# Patient Record
Sex: Female | Born: 1980 | Race: White | Hispanic: No | Marital: Married | Smoking: Current every day smoker
Health system: Southern US, Community
[De-identification: ages and names within clinical notes are randomized; demographics above are authoritative.]

## PROBLEM LIST (undated history)

## (undated) DIAGNOSIS — F17200 Nicotine dependence, unspecified, uncomplicated: Secondary | ICD-10-CM

## (undated) DIAGNOSIS — S37009A Unspecified injury of unspecified kidney, initial encounter: Secondary | ICD-10-CM

## (undated) DIAGNOSIS — A4902 Methicillin resistant Staphylococcus aureus infection, unspecified site: Secondary | ICD-10-CM

## (undated) HISTORY — DX: Nicotine dependence, unspecified, uncomplicated: F17.200

---

## 2005-09-01 HISTORY — PX: KIDNEY SURGERY: SHX687

## 2007-04-26 ENCOUNTER — Ambulatory Visit (HOSPITAL_COMMUNITY): Admission: RE | Admit: 2007-04-26 | Discharge: 2007-04-26 | Payer: Self-pay | Admitting: Emergency Medicine

## 2007-04-26 ENCOUNTER — Emergency Department (HOSPITAL_COMMUNITY): Admission: EM | Admit: 2007-04-26 | Discharge: 2007-04-26 | Payer: Self-pay | Admitting: Family Medicine

## 2007-04-29 ENCOUNTER — Ambulatory Visit: Payer: Self-pay | Admitting: *Deleted

## 2007-05-05 ENCOUNTER — Ambulatory Visit (HOSPITAL_COMMUNITY): Admission: RE | Admit: 2007-05-05 | Discharge: 2007-05-05 | Payer: Self-pay | Admitting: *Deleted

## 2007-05-05 ENCOUNTER — Ambulatory Visit: Payer: Self-pay | Admitting: *Deleted

## 2007-05-20 ENCOUNTER — Ambulatory Visit: Payer: Self-pay | Admitting: *Deleted

## 2009-02-06 ENCOUNTER — Emergency Department (HOSPITAL_COMMUNITY): Admission: EM | Admit: 2009-02-06 | Discharge: 2009-02-06 | Payer: Self-pay | Admitting: Family Medicine

## 2009-02-08 ENCOUNTER — Emergency Department (HOSPITAL_COMMUNITY): Admission: EM | Admit: 2009-02-08 | Discharge: 2009-02-08 | Payer: Self-pay | Admitting: Family Medicine

## 2009-02-10 ENCOUNTER — Emergency Department (HOSPITAL_COMMUNITY): Admission: EM | Admit: 2009-02-10 | Discharge: 2009-02-10 | Payer: Self-pay | Admitting: Family Medicine

## 2010-12-09 LAB — CULTURE, ROUTINE-ABSCESS

## 2011-01-14 NOTE — Op Note (Signed)
NAMEGIAMARIE, Karina [name]                ACCOUNT NO.:  [id number]   MEDICAL RECORD NO.:  [id number]          PATIENT TYPE:  AMB   LOCATION:  SDS                          FACILITY:  [facility]   PHYSICIAN:  Balinda Quails, M.D.    DATE OF BIRTH:  [birth year]   DATE OF PROCEDURE:  05/05/2007  DATE OF DISCHARGE:                               OPERATIVE REPORT   SURGEON:  Denman George, MD.   DIAGNOSIS:  Right renal infarction.   PROCEDURES:  1. Abdominal aortogram.  2. Selective right renal arteriogram.   ACCESS:  Right common femoral artery 5-French sheath.   CONTRAST:  Visipaque 80 mL.   COMPLICATIONS:  None apparent.   CLINICAL NOTE:  Karina [name] is a 30 year old female with  neurofibromatosis who presented to the emergency department last week  with right flank pain.  Workup for this revealed a right renal  infarction.  She is brought to the cath lab at this time for abdominal  aortogram and selective renal arteriogram.   DESCRIPTION OF PROCEDURE:  The patient brought to the cath lab in stable  condition.  Placed in supine position.  Skin and subcutaneous tissue of  the right groin instilled with 1% Xylocaine.  A needle introduced in the  right common femoral artery.  A 0.035 Wholey guidewire advanced through  the needle into the mid abdominal aorta.  A 5-French sheath advanced  over the guidewire.  Dilator removed, sheath flushed with heparin saline  solution.   Pigtail catheter advanced over the guidewire to the mid abdominal aorta.  Standard pigtail catheter advanced over the guidewire.  Standard AP mid  abdominal aortogram obtained.  Unfortunately, the patient had the  hiccups.  This obscured the aortogram.  However, it was clear that the  patient had single right renal artery.  There were two left renal  arteries.  An accessory pole left inferior renal artery was present.  There was brisk flow noted through the superior mesenteric and celiac  axis.  The juxtarenal and  infrarenal aorta appeared normal.  The common  iliac arteries bilaterally appeared normal.   Guidewire exchange then made for a short right coronary catheter.  This  was engaged into the right renal artery.  A selective right renal  arteriography obtained with hand injection of contrast.  Initial image  revealed the catheter to be in the right adrenal artery, catheter  withdrawn and placed in the right main renal artery.  Right renal  arteriography obtained with RAO projection.  This revealed the right  main renal artery to be widely patent.  There was a large superior pole  branch arising early which appeared normal.  The main right renal artery  appeared normal into the kidney.  The area of infarction of the inferior  pole was obvious.   There was no evidence of renal artery dissection or renal artery  aneurysm.   This completed the arteriogram procedure.  The guidewire reinserted,  catheter removed.  Right femoral sheath removed.   No apparent complications.   FINAL IMPRESSION:  1. Normal abdominal aortogram.  2. Normal  right renal arteriogram revealing right inferior pole renal      infarction.   RECOMMENDATION:  The patient will be discharged today.  Follow-up in the  office with further workup.      Balinda Quails, M.D.  Electronically Signed     PGH/MEDQ  D:  05/05/2007  T:  05/05/2007  Job:  [id number]

## 2011-01-14 NOTE — Consult Note (Signed)
NEW PATIENT CONSULTATION   [name], [name]  DOB:  November 29, 1980                                       04/29/2007  NUUVO#:[id number]   REASON FOR CONSULTATION:  Right renal infarction.   HISTORY OF PRESENT ILLNESS:  Karina [name] is a 30 year old female with  known neurofibromatosis who presented to the emergency department of  [facility] on April 26, 2007 with a history of approximately 2  days of right sided abdominal and flank pain. This was sudden in onset.  Felt to be severe in nature. Scout has a known history of  neurofibromatosis. Family history includes neurofibromatosis in her  mother, her maternal grandmother and her maternal great grandfather.   DIAGNOSTIC STUDIES:  In the emergency department, she underwent CT scan,  which verified a right renal infarction. No other abnormalities were  noted.   PAST MEDICAL HISTORY:  Neurofibromatosis.   PAST SURGICAL HISTORY:  None.   SOCIAL HISTORY:  The patient is single. She works as a Child psychotherapist at [employer].  She does have a steady boyfriend. She is sexually active. She has never  been pregnant. She smokes about 1/2 pack of cigarettes daily, consumes a  little alcohol.   REVIEW OF SYSTEMS:  With this pain, she has had a significant loss in  appetite. She otherwise, denies any symptoms of shortness of breath or  chest pain. No visual disturbance. Regular bowel habits. Regular  menstrual cycle.   FAMILY HISTORY:  Neurofibromatosis in documented in her mother, maternal  grandmother and her great grandfather.   MEDICATIONS:  Oxycodone 5 to 10 mg q.4 hours p.r.n.   ALLERGIES:  NO KNOWN DRUG ALLERGIES.   PHYSICAL EXAMINATION:  GENERAL:  A 30 year old female who appears her  stated age. She does have a few pigmented spots. She also has some  neuromas on the skin.  VITAL SIGNS:  Blood pressure 129/76, pulse 99 per minute.  NECK:  Supple. No thyromegaly or adenopathy.  CARDIOVASCULAR:  Normal heart sounds  without murmurs. No carotid bruits.  CHEST:  Equal entry bilaterally. No rales or rhonchi.  ABDOMEN:  Soft. Right mid abdominal and flank tenderness. No guarding or  rebound. Bowel sounds are normal. No abdominal bruits. 2+ femoral pulses  bilaterally.  EXTREMITIES:  Intact dorsalis pedis and posterior tibial pulses  bilaterally. No femoral bruits.   IMPRESSION:  1. Right renal infarction.  2. Neurofibromatosis.   RECOMMENDATIONS:  1. Genetic counseling. The patient referred to Dr. Charise Killian.  2. 2-D echocardiogram.  3. Renal arteriogram.   Balinda Quails, M.D.  Electronically Signed   PGH/MEDQ  D:  04/29/2007  T:  05/01/2007  Job:  [id number]   cc:   Link Snuffer, M.D.

## 2011-01-14 NOTE — Assessment & Plan Note (Signed)
OFFICE VISIT   LEONTINE, RADMAN A  DOB:  06-Feb-1981                                       05/20/2007  EAVWU#:[id number]   Patient returns today to review final results of her workup after  suffering a right inferior pole renal infarct.   She underwent a right renal arteriogram on 05/05/07.  This was  unremarkable.  There was evidence of right inferior pole renal  infarction on arteriography.   A 2D echo has been performed to rule out embolic source.  This was  unremarkable.   I have reviewed these results with the patient and her mother today.  Her blood pressure is 113/66, pulse 94, and respirations are 18.   Patient also has had significant improvement in her right flank pain.  Not taking anymore narcotics at this time and has begun to return to  work.   I discharged from further schedule to follow up at this time.  Return  p.r.n.   Balinda Quails, M.D.  Electronically Signed   PGH/MEDQ  D:  05/20/2007  T:  05/21/2007  Job:  [id number]

## 2011-01-14 NOTE — Procedures (Signed)
RENAL ARTERY DUPLEX EVALUATION   INDICATION:  Right lower quadrant pain radiating to the right side of  the back.   HISTORY:  Diabetes:  No  Cardiac:  No  Hypertension:  No  Smoking:  Yes.   RENAL ARTERY DUPLEX FINDINGS:  Aorta-Proximal:  119 cm/s  Aorta-Mid:  110 cm/s  Aorta-Distal:  100 cm/s  Celiac Artery Origin:  211cm/s  SMA Origin:  74 cm/s                                    RIGHT               LEFT  Renal Artery Origin:             74 cm/s             70 cm/s  Renal Artery Proximal:           80 cm/s             79 cm/s  Renal Artery Mid:                114 cm/s            78 cm/s  Renal Artery Distal:             108 cm/s            71 cm/s  Hilar Acceleration Time (AT):    m/s2                m/s2  Renal-Aortic Ratio (RAR):        0.95                0.66  Kidney Size:                     11.15 cm            12.08 cm  End Diastolic Ratio (EDR):  Resistive Index (RI):            0.52                0.70   IMPRESSION:  1. No evidence of renal artery stenosis noted bilaterally.  2. Normal resistive indices noted bilaterally.  3. Bilateral kidneys are within normal limits.   ___________________________________________  P. Liliane Bade, M.D.   MG/MEDQ  D:  04/29/2007  T:  04/30/2007  Job:  [id number]

## 2011-06-13 LAB — HEPATIC FUNCTION PANEL
ALT: 20
AST: 18
Albumin: 3.7
Alkaline Phosphatase: 57
Indirect Bilirubin: 0.6
Total Protein: 7.2

## 2011-06-13 LAB — CBC
HCT: 38.3
HCT: 36.8
Hemoglobin: 13.1
MCHC: 34.2
MCHC: 35
MCV: 88.8
MCV: 88.8
Platelets: 380
RBC: 4.15
RDW: 12.1
WBC: 12 — ABNORMAL HIGH

## 2011-06-13 LAB — COMPREHENSIVE METABOLIC PANEL
Albumin: 3.3 — ABNORMAL LOW
BUN: 14
Creatinine, Ser: 0.56
Glucose, Bld: 111 — ABNORMAL HIGH
Total Protein: 7.3

## 2011-06-13 LAB — URINALYSIS, ROUTINE W REFLEX MICROSCOPIC
Bilirubin Urine: NEGATIVE
Glucose, UA: NEGATIVE
Ketones, ur: 40 — AB
Nitrite: POSITIVE — AB
Specific Gravity, Urine: 1.025
pH: 6

## 2011-06-13 LAB — DIFFERENTIAL
Basophils Relative: 0
Eosinophils Absolute: 0
Eosinophils Relative: 0
Lymphs Abs: 1.3
Monocytes Absolute: 0.7
Monocytes Relative: 6
Neutrophils Relative %: 83 — ABNORMAL HIGH

## 2011-06-13 LAB — PROTIME-INR
INR: 1.1
Prothrombin Time: 14.2

## 2011-06-13 LAB — I-STAT 8, (EC8 V) (CONVERTED LAB)
BUN: 18
Bicarbonate: 22.2
Glucose, Bld: 102 — ABNORMAL HIGH
Hemoglobin: 13.6
TCO2: 23
pCO2, Ven: 32.7 — ABNORMAL LOW
pH, Ven: 7.439 — ABNORMAL HIGH

## 2011-06-13 LAB — POCT URINALYSIS DIP (DEVICE)
Bilirubin Urine: NEGATIVE
Ketones, ur: NEGATIVE
Nitrite: POSITIVE — AB
Protein, ur: NEGATIVE
pH: 5.5

## 2011-06-13 LAB — POCT PREGNANCY, URINE
Operator id: 235561
Preg Test, Ur: NEGATIVE

## 2011-06-13 LAB — URINE MICROSCOPIC-ADD ON

## 2011-06-13 LAB — APTT: aPTT: 29

## 2011-06-13 LAB — GC/CHLAMYDIA PROBE AMP, GENITAL: Chlamydia, DNA Probe: NEGATIVE

## 2012-05-07 ENCOUNTER — Emergency Department (INDEPENDENT_AMBULATORY_CARE_PROVIDER_SITE_OTHER)
Admission: EM | Admit: 2012-05-07 | Discharge: 2012-05-07 | Disposition: A | Discharge status: Home / Self Care | Payer: Self-pay | Source: Home / Self Care | Attending: Emergency Medicine | Admitting: Emergency Medicine

## 2012-05-07 ENCOUNTER — Encounter (HOSPITAL_COMMUNITY): Payer: Self-pay | Admitting: Emergency Medicine

## 2012-05-07 DIAGNOSIS — N764 Abscess of vulva: Secondary | ICD-10-CM

## 2012-05-07 HISTORY — DX: Unspecified injury of unspecified kidney, initial encounter: S37.009A

## 2012-05-07 HISTORY — DX: Methicillin resistant Staphylococcus aureus infection, unspecified site: A49.02

## 2012-05-07 MED ORDER — SULFAMETHOXAZOLE-TRIMETHOPRIM 800-160 MG PO TABS: 1.0000 | ORAL_TABLET | Freq: Two times a day (BID) | ORAL | Status: AC

## 2012-05-07 MED ORDER — IBUPROFEN 600 MG PO TABS: 600.0000 mg | ORAL_TABLET | Freq: Four times a day (QID) | ORAL | Status: AC | PRN

## 2012-05-07 MED ORDER — LIDOCAINE-EPINEPHRINE 2 %-1:100000 IJ SOLN
5.0000 mL | Freq: Once | INTRAMUSCULAR | Status: AC
  Administered 2012-05-07: 5 mL

## 2012-05-07 MED ORDER — CHLORHEXIDINE GLUCONATE 4 % EX LIQD: 60.0000 mL | Freq: Every day | CUTANEOUS | Status: AC | PRN

## 2012-05-07 MED ORDER — BACITRACIN 500 UNIT/GM EX OINT
1.0000 "application " | TOPICAL_OINTMENT | Freq: Once | CUTANEOUS | Status: AC
  Administered 2012-05-07: 1 via TOPICAL

## 2012-05-07 NOTE — ED Notes (Signed)
Pt reports "boil" on left labia. Onset 2-3 days ago. Hx of same one year ago.

## 2012-05-07 NOTE — ED Notes (Signed)
Pt instructed to do Sitz baths 3-4 x day, apply bacitracin and DSD after each Sitz bath.

## 2012-05-07 NOTE — ED Provider Notes (Signed)
History     CSN: [id number]  Arrival date & time 05/07/12  0801   First MD Initiated Contact with Patient 05/07/12 [phone]      Chief Complaint  Patient presents with  . Recurrent Skin Infections    (Consider location/radiation/quality/duration/timing/severity/associated sxs/prior treatment) HPI Comments: Pt with painful erythematous mass of gradually increasing size in her left outer labia.  Has been doing warm sitz baths and warm compresses, and has able to express some purulent drainage from it. Took ibuprofen 600 mg this morning No N/V, fevers, drainage. H/o of same symptoms in this area in 2005, or heart drainage. h/o of MRSA abscess on posterior right thigh in 2010. She was unable tolerate Bactrim, caused stomach upset-but was able to take doxycycline without problems.  ROS as noted in HPI. All other ROS negative.   Patient is a 31 y.o. female presenting with abscess. The history is provided by the patient. No language interpreter was used.  Abscess  The current episode started less than one week ago. The onset was gradual. The problem has been unchanged. The abscess is present on the genitalia. The abscess is characterized by painfulness, draining and swelling. It is unknown what she was exposed to. The abscess first occurred at home. Pertinent negatives include no fever. There were no sick contacts.    Past Medical History  Diagnosis Date  . 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 MRSA (methicillin resistant Staphylococcus aureus)     Past Surgical History  Procedure Date  . Kidney surgery     TO REMOVE BLOD CLOT    History reviewed. No pertinent family history.  History  Substance Use Topics  . Smoking status: Current Everyday Smoker -- 1.0 packs/day  . Smokeless tobacco: Not on file  . Alcohol Use: No    OB History    Grav Para Term Preterm Abortions TAB SAB Ect Mult Living                  Review of Systems  Constitutional: Negative  for fever.    Allergies  Review of patient's allergies indicates no known allergies.  Home Medications   Current Outpatient Rx  Name Route Sig Dispense Refill  . IBUPROFEN 600 MG PO TABS Oral Take 600 mg by mouth every 6 (six) hours as needed.    . CHLORHEXIDINE GLUCONATE 4 % EX LIQD Topical Apply 60 mLs (4 application total) topically daily as needed. Use daily when bathing for 1-2 weeks 120 mL 0  . IBUPROFEN 600 MG PO TABS Oral Take 1 tablet (600 mg total) by mouth every 6 (six) hours as needed for pain. 30 tablet 0  . SULFAMETHOXAZOLE-TRIMETHOPRIM 800-160 MG PO TABS Oral Take 1 tablet by mouth 2 (two) times daily. X 10 days 20 tablet 0    BP 103/76  Pulse 93  Temp 97.6 F (36.4 C) (Oral)  Resp 18  SpO2 99%  LMP 05/03/2012  Physical Exam  Nursing note and vitals reviewed. Constitutional: She is oriented to person, place, and time. She appears well-developed and well-nourished. No distress.  HENT:  Head: Normocephalic and atraumatic.  Eyes: Conjunctivae and EOM are normal.  Neck: Normal range of motion.  Cardiovascular: Normal rate.   Pulmonary/Chest: Effort normal.  Abdominal: She exhibits no distension.  Genitourinary: Vagina normal.    There is rash, tenderness and lesion on the left labia.       Tender, erythematous mass with central pustule approximately 1  x 2 cm left lower labia, see drawing. No evidence of Bartholin's cyst.  Musculoskeletal: Normal range of motion.  Neurological: She is alert and oriented to person, place, and time. Coordination normal.  Skin: Skin is warm and dry.  Psychiatric: She has a normal mood and affect. Her behavior is normal. Judgment and thought content normal.    ED Course  INCISION AND DRAINAGE Date/Time: 05/07/2012 9:08 AM Performed by: Luiz Blare Authorized by: Luiz Blare Consent: Verbal consent obtained. Risks and benefits: risks, benefits and alternatives were discussed Consent given by: patient Patient  understanding: patient states understanding of the procedure being performed Patient consent: the patient's understanding of the procedure matches consent given Procedure consent: procedure consent matches procedure scheduled Required items: required blood products, implants, devices, and special equipment available Patient identity confirmed: verbally with patient Time out: Immediately prior to procedure a "time out" was called to verify the correct patient, procedure, equipment, support staff and site/side marked as required. Type: abscess Body area: anogenital Location details: vulva Anesthesia: local infiltration Local anesthetic: lidocaine 2% with epinephrine Anesthetic total: 1 ml Patient sedated: no Scalpel size: 11 Incision type: cruciate. Complexity: simple Drainage: purulent Drainage amount: moderate Wound treatment: wound left open Packing material: none Patient tolerance: Patient tolerated the procedure well with no immediate complications. Comments: Sent drainage off for cx   (including critical care time)   Labs Reviewed  CULTURE, ROUTINE-ABSCESS   No results found.   1. Abscess of left genital labia     MDM  Previous chart, labs reviewed as noted in history of present illness  Prev abscess grew out mrsa but this was on back of leg. Today appears to be folliculitis/small abscess external L  Will send home with doxy given h/o mrsa.  RTC if no significant improvement in 2 days sooner if she gets worse. Referring to primary care resources for routine medical care pt agrees with plan   Luiz Blare, MD 05/07/12 [phone]

## 2012-05-10 ENCOUNTER — Telehealth (HOSPITAL_COMMUNITY): Payer: Self-pay | Admitting: *Deleted

## 2012-05-10 LAB — CULTURE, ROUTINE-ABSCESS: Gram Stain: NONE SEEN

## 2012-05-10 NOTE — ED Notes (Signed)
Abscess culture L outer labia:  Mod. MRSA. Pt. adequately treated with Septra DS. I called and left a message to call on home and cell numbers.

## 2012-05-11 ENCOUNTER — Telehealth (HOSPITAL_COMMUNITY): Payer: Self-pay | Admitting: *Deleted

## 2012-05-11 NOTE — ED Notes (Signed)
Pt. called back.  Pt. verified x 2 and given results.  Pt. Told she was adequately treated with Septra DS.  Pt. said she had MRSA 2 yrs ago.  Reviewed MRSA instructions. Pt. c/o nausea while taking antibiotics.  Instructed take antibiotics with food.  Pt. voiced understanding. Vassie Moselle 05/11/2012

## 2012-12-20 ENCOUNTER — Encounter: Payer: Self-pay | Admitting: Family Medicine

## 2012-12-20 ENCOUNTER — Ambulatory Visit (INDEPENDENT_AMBULATORY_CARE_PROVIDER_SITE_OTHER): Payer: No Typology Code available for payment source | Admitting: Family Medicine

## 2012-12-20 VITALS — BP 92/60 | HR 84 | Ht 60.0 in | Wt 190.0 lb

## 2012-12-20 DIAGNOSIS — K219 Gastro-esophageal reflux disease without esophagitis: Secondary | ICD-10-CM | POA: Insufficient documentation

## 2012-12-20 DIAGNOSIS — E669 Obesity, unspecified: Secondary | ICD-10-CM | POA: Insufficient documentation

## 2012-12-20 DIAGNOSIS — F17200 Nicotine dependence, unspecified, uncomplicated: Secondary | ICD-10-CM

## 2012-12-20 NOTE — Patient Instructions (Addendum)
Diet for Gastroesophageal Reflux Disease, Adult Reflux (acid reflux) is when acid from your stomach flows up into the esophagus. When acid comes in contact with the esophagus, the acid causes irritation and soreness (inflammation) in the esophagus. When reflux happens often or so severely that it causes damage to the esophagus, it is called gastroesophageal reflux disease (GERD). Nutrition therapy can help ease the discomfort of GERD. FOODS OR DRINKS TO AVOID OR LIMIT  Smoking or chewing tobacco. Nicotine is one of the most potent stimulants to acid production in the gastrointestinal tract.  Caffeinated and decaffeinated coffee and black tea.  Regular or low-calorie carbonated beverages or energy drinks (caffeine-free carbonated beverages are allowed).   Strong spices, such as black pepper, white pepper, red pepper, cayenne, curry powder, and chili powder.  Peppermint or spearmint.  Chocolate.  High-fat foods, including meats and fried foods. Extra added fats including oils, butter, salad dressings, and nuts. Limit these to less than 8 tsp per day.  Fruits and vegetables if they are not tolerated, such as citrus fruits or tomatoes.  Alcohol.  Any food that seems to aggravate your condition. If you have questions regarding your diet, call your caregiver or a registered dietitian. OTHER THINGS THAT MAY HELP GERD INCLUDE:   Eating your meals slowly, in a relaxed setting.  Eating 5 to 6 small meals per day instead of 3 large meals.  Eliminating food for a period of time if it causes distress.  Not lying down until 3 hours after eating a meal.  Keeping the head of your bed raised 6 to 9 inches (15 to 23 cm) by using a foam wedge or blocks under the legs of the bed. Lying flat may make symptoms worse.  Being physically active. Weight loss may be helpful in reducing reflux in overweight or obese adults.  Wear loose fitting clothing EXAMPLE MEAL PLAN This meal plan is approximately  2,000 calories based on https://www.bernard.org/ meal planning guidelines. Breakfast   cup cooked oatmeal.  1 cup strawberries.  1 cup low-fat milk.  1 oz almonds. Snack  1 cup cucumber slices.  6 oz yogurt (made from low-fat or fat-free milk). Lunch  2 slice whole-wheat bread.  2 oz sliced [place].  2 tsp mayonnaise.  1 cup blueberries.  1 cup snap peas. Snack  6 whole-wheat crackers.  1 oz string cheese. Dinner   cup brown rice.  1 cup mixed veggies.  1 tsp olive oil.  3 oz grilled fish. Document Released: 08/18/2005 Document Revised: 11/10/2011 Document Reviewed: 07/04/2011 [facility] Patient Information 2013 [place], [place].  Start taking Prilosec OTC once daily--okay to take prior to dinner (30 min).  If your symptoms are worse during the day, rather than at night, you can switch to morning dosing (before breakfast).  If needed, you can take it twice daily, short-term (for 2-4 weeks).  Okay to use prilosec longer than the 2 weeks that the box says, but try to make changes in the diet/behavior so that you can cut back to taking it just as needed, rather than every day, if you are able to.  Please try and quit smoking--start thinking about why/when you smoke (habit, boredom, stress) in order to come up with effective strategies to cut back or quit. Available resources to help you quit include free counseling through [facility] ([facility]Quitline.com or 1-800-QUITNOW), smoking cessation classes through [facility] (call to find out schedule), over-the-counter nicotine replacements, and e-cigarettes (although this may not help break the  hand-mouth habit).  Many insurance companies also have smoking cessation programs (which may decrease the cost of patches, meds if enrolled).  If these methods are not effective for you, and you are motivated to quit, return to discuss the possibility of prescription medications.

## 2012-12-20 NOTE — Progress Notes (Signed)
Chief Complaint  Patient presents with  . Heartburn    new patient that complains of heartburn every day x 1 year, at least.    She has had daily heartburn for about a year.  Any food/drink seems to cause heartburn.  She has been taking Tums three times daily, and sometimes helps.  She has tried Zantac, without much effect.  Prilosec was helpful when tried in the past, but she would forget to take it.  Denies any dysphagia.  She has nocturnal symptoms and has had to get up to vomit.  Denies abdominal pain.  Denies bloody or black BM.  Denies significant changes in diet.  She is a smoker.  She denies pregnancy--has had unprotected intercourse, but reports she has done this for a while, doesn't think she can get pregnant.  Denies current pregnancy--LMP was 4/1.    Past Medical History  Diagnosis Date  . 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 MRSA (methicillin resistant Staphylococcus aureus)     (labial, and posterior thigh)  . Smoker     Past Surgical History  Procedure Laterality Date  . Kidney surgery  2007    TO REMOVE BLO)D CLOT    History   Social History  . Marital Status: Single    Spouse Name: N/A    Number of Children: N/A  . Years of Education: N/A   Occupational History  . Environmental health practitioner at [employer]    Social History Main Topics  . Smoking status: Current Every Day Smoker -- 1.00 packs/day    Types: Cigarettes  . Smokeless tobacco: Never Used  . Alcohol Use: Yes     Comment: very rarely.  . Drug Use: No  . Sexually Active: Yes -- Female partner(s)    Birth Control/ Protection: None   Other Topics Concern  . Not on file   Social History Narrative   Lives with fiance, 1 cat, 1 dog    Family History  Problem Relation Age of Onset  . Cancer Mother     vulvar cancer  . Heart disease Neg Hx   . Stroke Neg Hx     Current outpatient prescriptions:aspirin 81 MG chewable tablet, Chew 81 mg by mouth daily., Disp: , Rfl: ;  calcium  carbonate (TUMS - DOSED IN MG ELEMENTAL CALCIUM) 500 MG chewable tablet, Chew 3 tablets by mouth 3 (three) times daily., Disp: , Rfl: ;  ibuprofen (ADVIL,MOTRIN) 600 MG tablet, Take 600 mg by mouth every 6 (six) hours as needed., Disp: , Rfl:  naproxen sodium (ANAPROX) 220 MG tablet, Take 220 mg by mouth 2 (two) times daily with a meal., Disp: , Rfl:  (she only rarely uses ibuprofen or naproxen, for headaches)  No Known Allergies  ROS:  Denies fevers, URI/allergy symptoms, chest pain, palpitations, bowel changes, urinary complaints, bleeding/bruising, rashes, edema. Occasional headache.  PHYSICAL EXAM: BP 92/60  Pulse 84  Ht 5' (1.524 m)  Wt 190 lb (86.183 kg)  BMI 37.11 kg/m2  LMP 11/30/2012 Well developed, pleasant overweight/obese female in no distress HEENT:  Conjunctiva clear, PERRL Neck: no lymphadenopathy, thyromegaly or mass Heart: regular rate and rhythm without murmur Lungs: clear bilaterally Abdomen: soft, nontender, no organomegaly or mass Extremities: no edema Skin: no rash Psych: normal mood, affect, hygiene and grooming Neuro: alert and oriented.  Cranial nerves grossly intact, normal gait  ASSESSMENT/PLAN: GERD (gastroesophageal reflux disease)  Tobacco use disorder  Obesity (BMI 30-39.9)  Reflux precautions and  behavioral recommendations reviewed in detail.   Recommended Prilosec OTC once daily.  She has trouble remembering to take in mornings, so suggested starting it 1/2 hr prior to dinner since she also has nocturnal symptoms.  Reviewed longterm risks of untreated reflux, and longterm use of PPI. Safer to take PPI longterm (if needed), along with MVI with Mg and Ca, rather than untreated reflux.  All questions were answered.  Counseled regarding risks of smoking, and the contribution to her reflux symptoms. Weight loss encouraged.  See handouts given.  F/u as scheduled for CPE, sooner prn.  30 minute visit, more than 1/2 spent counseling

## 2013-05-09 ENCOUNTER — Encounter: Payer: Self-pay | Admitting: Family Medicine

## 2014-05-11 ENCOUNTER — Emergency Department (HOSPITAL_COMMUNITY)
Admission: EM | Admit: 2014-05-11 | Discharge: 2014-05-11 | Discharge status: Against Medical Advice | Payer: Self-pay | Attending: Emergency Medicine | Admitting: Emergency Medicine

## 2014-05-11 ENCOUNTER — Emergency Department (HOSPITAL_COMMUNITY): Payer: Self-pay

## 2014-05-11 ENCOUNTER — Encounter (HOSPITAL_COMMUNITY): Payer: Self-pay | Admitting: Emergency Medicine

## 2014-05-11 DIAGNOSIS — R51 Headache: Secondary | ICD-10-CM | POA: Insufficient documentation

## 2014-05-11 DIAGNOSIS — R209 Unspecified disturbances of skin sensation: Secondary | ICD-10-CM | POA: Insufficient documentation

## 2014-05-11 DIAGNOSIS — R29810 Facial weakness: Secondary | ICD-10-CM | POA: Insufficient documentation

## 2014-05-11 DIAGNOSIS — Z87828 Personal history of other (healed) physical injury and trauma: Secondary | ICD-10-CM | POA: Insufficient documentation

## 2014-05-11 DIAGNOSIS — Z3202 Encounter for pregnancy test, result negative: Secondary | ICD-10-CM | POA: Insufficient documentation

## 2014-05-11 DIAGNOSIS — M542 Cervicalgia: Secondary | ICD-10-CM | POA: Insufficient documentation

## 2014-05-11 DIAGNOSIS — F17200 Nicotine dependence, unspecified, uncomplicated: Secondary | ICD-10-CM | POA: Insufficient documentation

## 2014-05-11 DIAGNOSIS — Z79899 Other long term (current) drug therapy: Secondary | ICD-10-CM | POA: Insufficient documentation

## 2014-05-11 DIAGNOSIS — R519 Headache, unspecified: Secondary | ICD-10-CM

## 2014-05-11 DIAGNOSIS — Z8614 Personal history of Methicillin resistant Staphylococcus aureus infection: Secondary | ICD-10-CM | POA: Insufficient documentation

## 2014-05-11 LAB — BASIC METABOLIC PANEL
Anion gap: 13 (ref 5–15)
BUN: 12 mg/dL (ref 6–23)
CO2: 22 mEq/L (ref 19–32)
Calcium: 9.2 mg/dL (ref 8.4–10.5)
Chloride: 104 mEq/L (ref 96–112)
Creatinine, Ser: 0.68 mg/dL (ref 0.50–1.10)
GFR calc Af Amer: >90 mL/min (ref >90)
GFR calc non Af Amer: >90 mL/min (ref >90)
Glucose, Bld: 78 mg/dL (ref 70–99)
Potassium: 4.4 mEq/L (ref 3.7–5.3)
Sodium: 139 mEq/L (ref 137–147)

## 2014-05-11 LAB — CBC WITH DIFFERENTIAL/PLATELET
Basophils Absolute: 0 10*3/uL (ref 0.0–0.1)
Basophils Relative: 0 % (ref 0–1)
Eosinophils Absolute: 0 10*3/uL (ref 0.0–0.7)
Eosinophils Relative: 1 % (ref 0–5)
HCT: 38.3 % (ref 36.0–46.0)
Hemoglobin: 13.1 g/dL (ref 12.0–15.0)
Lymphocytes Relative: 17 % (ref 12–46)
Lymphs Abs: 1.1 10*3/uL (ref 0.7–4.0)
MCH: 27.4 pg (ref 26.0–34.0)
MCHC: 34.2 g/dL (ref 30.0–36.0)
MCV: 80.1 fL (ref 78.0–100.0)
Monocytes Absolute: 0.7 10*3/uL (ref 0.1–1.0)
Monocytes Relative: 11 % (ref 3–12)
Neutro Abs: 4.6 10*3/uL (ref 1.7–7.7)
Neutrophils Relative %: 71 % (ref 43–77)
Platelets: 310 10*3/uL (ref 150–400)
RBC: 4.78 MIL/uL (ref 3.87–5.11)
RDW: 13.8 % (ref 11.5–15.5)
WBC: 6.5 10*3/uL (ref 4.0–10.5)

## 2014-05-11 LAB — POC URINE PREG, ED: Preg Test, Ur: NEGATIVE

## 2014-05-11 MED ORDER — PREDNISONE 20 MG PO TABS: ORAL_TABLET | ORAL | Status: DC

## 2014-05-11 MED ORDER — ACYCLOVIR 200 MG PO CAPS
400.0000 mg | ORAL_CAPSULE | Freq: Once | ORAL | Status: AC
  Administered 2014-05-11: 400 mg via ORAL
  Filled 2014-05-11: qty 2

## 2014-05-11 MED ORDER — ACYCLOVIR 400 MG PO TABS
400.0000 mg | ORAL_TABLET | Freq: Every day | ORAL | Status: DC
Start: 2014-05-11 — End: 2018-08-29

## 2014-05-11 MED ORDER — PREDNISONE 20 MG PO TABS
60.0000 mg | ORAL_TABLET | Freq: Once | ORAL | Status: AC
  Administered 2014-05-11: 60 mg via ORAL
  Filled 2014-05-11: qty 3

## 2014-05-11 NOTE — ED Provider Notes (Signed)
Patient signed out to me by Karina Mussel, PA-C at change of shift. Patient with sx consistent with Bell's Palsy, although atypical features with right arm numbness. Imaging and labs are unremarkable. MRI pending. If MRI normal, patient to be discharged home. Neurology has evaluated patient and agrees with plan.   Patient does not want MRI. She states that she cannot pay for it because she does not have insurance. She understands that we cannot rule out that she had a stroke as the cause of her symptoms and the risks associated with this including permanent morbidity and death. Patient will take treatment for Bell's Palsy. Discussed reasons to return to ED immediately. She was encouraged to return if she decides she wants the MRI. Vital signs stable.   Results for orders placed during the hospital encounter of 05/11/14  CBC WITH DIFFERENTIAL      Result Value Ref Range   WBC 6.5  4.0 - 10.5 K/uL   RBC 4.78  3.87 - 5.11 MIL/uL   Hemoglobin 13.1  12.0 - 15.0 g/dL   HCT 16.1  09.6 - 04.5 %   MCV 80.1  78.0 - 100.0 fL   MCH 27.4  26.0 - 34.0 pg   MCHC 34.2  30.0 - 36.0 g/dL   RDW 40.9  81.1 - 91.4 %   Platelets 310  150 - 400 K/uL   Neutrophils Relative % 71  43 - 77 %   Neutro Abs 4.6  1.7 - 7.7 K/uL   Lymphocytes Relative 17  12 - 46 %   Lymphs Abs 1.1  0.7 - 4.0 K/uL   Monocytes Relative 11  3 - 12 %   Monocytes Absolute 0.7  0.1 - 1.0 K/uL   Eosinophils Relative 1  0 - 5 %   Eosinophils Absolute 0.0  0.0 - 0.7 K/uL   Basophils Relative 0  0 - 1 %   Basophils Absolute 0.0  0.0 - 0.1 K/uL  BASIC METABOLIC PANEL      Result Value Ref Range   Sodium 139  137 - 147 mEq/L   Potassium 4.4  3.7 - 5.3 mEq/L   Chloride 104  96 - 112 mEq/L   CO2 22  19 - 32 mEq/L   Glucose, Bld 78  70 - 99 mg/dL   BUN 12  6 - 23 mg/dL   Creatinine, Ser 7.82  0.50 - 1.10 mg/dL   Calcium 9.2  8.4 - 95.6 mg/dL   GFR calc non Af Amer >90  >90 mL/min   GFR calc Af Amer >90  >90 mL/min   Anion gap 13  5 - 15  POC  URINE PREG, ED      Result Value Ref Range   Preg Test, Ur NEGATIVE  NEGATIVE   Ct Head Wo Contrast  05/11/2014   CLINICAL DATA:  33 year old female with a history of right arm and face weakness  EXAM: CT HEAD WITHOUT CONTRAST  TECHNIQUE: Contiguous axial images were obtained from the base of the skull through the vertex without intravenous contrast.  COMPARISON:  None.  FINDINGS: Unremarkable appearance of the skull and visualized facial bones. No acute fracture identified. No aggressive or destructive lesions identified. Mastoid cells remain aerated.  Soft tissue density at the left vertex of the skull overlying the parietal bone, may represent a sebaceous cyst.  Unremarkable appearance of the visualized orbits.  No acute intracranial hemorrhage. No midline shift or mass effect. Gray-white differentiation maintained without evidence of acute ischemia.  IMPRESSION: No CT evidence of acute intracranial abnormality.  These results were called by telephone at the time of interpretation on 05/11/2014 at 12:47 pm to Dr. Silverio Lay , who verbally acknowledged these results.  Signed,  Yvone Neu. Loreta Ave, DO  [facility]  [facility]   Electronically Signed   By: Gilmer Mor O.D.   On: 05/11/2014 12:47     Mora Bellman, PA-C 05/11/14 1710

## 2014-05-11 NOTE — ED Provider Notes (Signed)
Medical screening examination/treatment/procedure(s) were conducted as a shared visit with non-physician practitioner(s) and myself.  I personally evaluated the patient during the encounter.   EKG Interpretation   Date/Time:  Thursday May 11 2014 11:11:42 EDT Ventricular Rate:  92 PR Interval:  118 QRS Duration: 84 QT Interval:  342 QTC Calculation: 422 R Axis:   37 Text Interpretation:  Normal sinus rhythm Normal ECG No significant change  since last tracing Confirmed by Minela Bridgewater  MD, Kaylyn Garrow ([id number]) on 05/11/2014  11:49:46 AM      Karina [name] is a 33 y.o. female here with R arm numbness, R facial numbness and weakness. R arm numbness since yesterday. Woke up this AM with R facial weakness and numbness. On exam, obvious Bell's palsy. Slightly dec sensation R arm, but nl motor strength. CT head unremarkable. Neuro consulted, recommend MRI. Signed out to the next team.    Richardean Canal, MD 05/11/14 [id number]

## 2014-05-11 NOTE — ED Notes (Signed)
Pt states yesterday she woke up with right arm pain and tingling in her finger tips. Today noted to have right facial numbness and droop when she woke up. Pt with right sided facial droop, no slurred speech noted, no arm drift. Pt awake, alert, oriented x4, NAD.

## 2014-05-11 NOTE — Discharge Instructions (Signed)
°Emergency Department Resource Guide °1) Find a Doctor and Pay Out of Pocket °Although you won't have to find out who is covered by your insurance plan, it is a good idea to ask around and get recommendations. You will then need to call the office and see if the doctor you have chosen will accept you as a new patient and what types of options they offer for patients who are self-pay. Some doctors offer discounts or will set up payment plans for their patients who do not have insurance, but you will need to ask so you aren't surprised when you get to your appointment. ° °2) Contact Your Local Health Department °Not all health departments have doctors that can see patients for sick visits, but many do, so it is worth a call to see if yours does. If you don't know where your local health department is, you can check in your phone book. The CDC also has a tool to help you locate your state's health department, and many state websites also have listings of all of their local health departments. ° °3) Find a Walk-in Clinic °If your illness is not likely to be very severe or complicated, you may want to try a walk in clinic. These are popping up all over the country in pharmacies, drugstores, and shopping centers. They're usually staffed by nurse practitioners or physician assistants that have been trained to treat common illnesses and complaints. They're usually fairly quick and inexpensive. However, if you have serious medical issues or chronic medical problems, these are probably not your best option. ° °No Primary Care Doctor: °- Call [facility] at  832-8000 - they can help you locate a primary care doctor that  accepts your insurance, provides certain services, etc. °- Physician Referral Service- 1-800-533-3463 ° °Chronic Pain Problems: °Organization         Address  Phone   Notes  °[facility]  (336) 297-2271 Patients need to be referred by their primary care doctor.  ° °Medication  Assistance: °Organization         Address  Phone   Notes  °[place] [facility] [address] °[place], [place] [address] (336) 641-8030 --Must be a resident of [place] °-- Must have NO insurance coverage whatsoever (no Medicaid/ Medicare, etc.) °-- The pt. MUST have a primary care doctor that directs their care regularly and follows them in the community °  °[facility]  (866) 331-1348   °[employer]  (888) 892-1162   ° °Agencies that provide inexpensive medical care: °Organization         Address  Phone   Notes  °[facility]  (336) 832-8035   °[facility]    (336) 832-7272   °[facility] [address] °[place], [place] [address] (336) 832-4777   °[facility] [address], °[place] (336) 271-4999   °[facility]    (336) 373-0678   °[facility]    (336) 272-1050   °[facility] ° [address], [place] Phone:  (336) 832-4444, Fax:  (336) 832-4440 Hours of Operation:  9 am - 6 pm, M-F.  Also accepts Medicaid/Medicare and self-pay.  °[facility] ° [address],  Phone: (336) 832-3150, Fax: (336) 832-3151. Hours of Operation:  8:30 am - 5:30 pm, M-F.  Also accepts Medicaid and self-pay.  °[facility] [place] [address], [place] Phone: (336) 878-6027   °[facility] [address], [place], [place] (336)723-1848, Ext. 123 Mondays & Thursdays: 7-9 AM.  First 15 patients are seen on a first come, first serve basis. °  ° °Medicaid-accepting [place] Providers: ° °Organization         Address  Phone   Notes  °[facility] [address], [place] (336) 641-2100 Also accepts self-pay patients.  °[facility] [address], [place] ° (336) 856-9996   °[facility] [address], [place]  (336) 288-8857   °[facility] [address] [place] [address], [place] (336) 299-7000   °Veita Bland [address], [place]  ° (336) 373-1557 Only accepts [place] Access Medicaid patients after they have their name applied to their card.  ° °Self-Pay (no insurance) in [place]: ° °Organization         Address  Phone   Notes  °Sickle Cell Patients, [facility] [address], [place] (336) 832-1970   °[facility] [address], [place] (336) 832-4400   °[facility] ° [address], [place] (336) 992-4800   °[facility]/Dr. Osei-Bonsu ° [address] [place] [address], [place] or [address], [place] (336) 841-8500 Phone number for both [place] and [place] locations is the same.  °[facility] [address], [place] (336) 299-0000   °[facility] [address] [place] [address], [place] or [address] (336) 852-7530 °(336) 878-2260   °[facility] [address], [place] (336) 350-1642, phone; (336) 294-5005, fax Sees patients 1st and 3rd Saturday of every month.  Must not qualify for public or private insurance (i.e. Medicaid, Medicare, [place] Health Choice, Veterans' Benefits) • Household income should be no more than 200% of the poverty level •The clinic cannot treat you if you are pregnant or think you are pregnant • Sexually transmitted diseases are not treated at the clinic.  ° ° °Dental Care: °Organization         Address  Phone  Notes  °[place] [facility] [address], [place] (336) 641-6152 Accepts children up to age 21 who are enrolled in Medicaid or [place] Health Choice; pregnant women with a Medicaid card; and children who have applied for Medicaid or [place] Health Choice, but were declined, whose parents can pay a reduced fee at time of service.  °[place]  [facility] [place]  [address], [place] (336) 641-7733 Accepts children up to age 21 who are enrolled in Medicaid or [place] Health Choice; pregnant women with a Medicaid card; and children who have applied for Medicaid or [place] Health Choice, but were declined, whose parents can pay a reduced fee at time of service.  °[facility] ° [address],  (336) 641-4533 Patients are seen by appointment only. Walk-ins are not accepted. [facility] will see patients 18 years of age and older. °Monday - Tuesday (8am-5pm) °Most Wednesdays (8:30-5pm) °$30 per visit, cash only  °[facility] ° [address], [place] (336) 641-4533 Patients are seen by appointment only. Walk-ins are not accepted. [facility] will see patients 18 years of age and older. °One   Wednesday Evening (Monthly: Volunteer Based).  $30 per visit, cash only  °[facility]  (919) 537-3737 for adults; Children under age 4, call Graduate Pediatric Dentistry at (919) [phone]. Children aged 4-14, please call (919) 537-3737 to request a pediatric application. ° Dental services are provided in all areas of dental care including fillings, crowns and bridges, complete and partial dentures, implants, gum treatment, root canals, and extractions. Preventive care is also provided. Treatment is provided to both adults and children. °Patients are selected via a lottery and there is often a waiting list. °  °[facility] [address], °[place] ° (336) 763-8833 [email or web address] °  °[facility] [address], [place], [place] (336)723-1848, Ext. 123 Second and Fourth Thursday of each month, opens at 6:30 AM; Clinic ends at 9 AM.  Patients are seen on a first-come first-served basis, and a limited number are seen during each clinic.  ° °[facility] ° [address], [place], [place] (336) 723-7904    Eligibility Requirements °You must have lived in [place], [place], or [place] counties for at least the last three months. °  You cannot be eligible for state or federal sponsored healthcare insurance, including Veterans Administration, Medicaid, or Medicare. °  You generally cannot be eligible for healthcare insurance through your employer.  °  How to apply: °Eligibility screenings are held every Tuesday and Wednesday afternoon from 1:00 pm until 4:00 pm. You do not need an appointment for the interview!  °[facility] [address], [place], [place] 336-631-2330   °[place] [facility]  336-342-8273   °[place]  336-703-3100   °[facility]  336-570-6415   ° °Behavioral Health Resources in the Community: °Intensive Outpatient Programs °Organization         Address  Phone  Notes  °[facility] [address], [place], [place] 336-878-6098   °[facility] [address], [place], [place] 336-832-9800   °[facility] [address], [place], [place] ° 336-882-2125   °[facility] [address],  °[place], [place] 1-800-853-5163 or 336-641-4981   °Substance Abuse Resources °Organization         Address  Phone  Notes  °[facility]  336-882-2125   °[facility]  336-784-9470   °[facility]  336-285-9073   °[facility]  336-845-3988   °Residential & Outpatient Substance Abuse Program  1-800-659-3381   °Psychological Services °Organization         Address  Phone  Notes  °[facility]  336- 832-9600   °[facility]  336- 378-7881   °[facility] [address], [place] 1-800-853-5163 or 336-641-4981   ° °Mobile Crisis Teams °Organization         Address  Phone  Notes  °[facility], Mobile Crisis Care Unit  1-877-626-1772   °Assertive °[facility] ° [address]  [place], [place] 336-834-9664   °Sharon DeEsch [address] °[place] 336-554-5454   ° °Self-Help/Support Groups °Organization         Address  Phone             Notes  °[facility] [place] - variety of support groups  336- 373-1402 Call for more information  °Narcotics Anonymous (NA), [facility] [address], °[place]  2 meetings at this location  ° °  Residential Treatment Programs °Organization         Address  Phone  Notes  °[facility] [address],    °[place]  1-866-801-8205   °[facility] ° [address], [place], [place] 704-293-8524   °[facility] [address], [place] 336-845-3988 Admissions: 8am-3pm M-F  °[facility] [address]    °[place], [place] 336-841-1104   °[facility] [address], [place], [place] 336-379-7146   °[facility] [address]  °[place], [place] 336-285-9073   °[facility] Intensive Outpatient [address], [place], [place] 336-852-3033   °[facility] [address]  °[place], [place] 1-877-615-2722 or 336-784-9470   °[facility] [address] [place], [place] 336-227-7417 Accepts Medicaid  °[facility] [address]  °[place] 1-800-659-3381 Substance Abuse/Addiction Treatment  ° °[place] Behavioral Health Resources °Organization         Address  Phone  Notes  °[facility]  (888) 581-9988   °Julie Brannon, PhD [address] [place], [place]   (336) 349-5553 or (336) 951-0000   °[facility]   [address] °[place], [place] (336) 349-4454   °[facility] [address], [place], [place] (336) 342-8316 Insurance/Medicaid/sponsorship through [facility]  °[facility] [address]                                    [place], [place] (336) 342-8316 Therapy/tele-psych/case    °[facility] [address]  ° [place] (336) 349-2233    °Dr. Arfeen  (336) 349-4544   °[facility] [place]  [employer] [place] [facility] 1) [address], [place] °2) [address], [place] °3)  [address], [place] (336) 349-3220 °(336) 342-7768 ° °(336) 342-8140   °[place] Child [facility] (336) 342-1394 or (336) 342-3537 (After Hours)    ° ° °

## 2014-05-11 NOTE — ED Provider Notes (Signed)
CSN: [id number]     Arrival date & time 05/11/14  1043 History   First MD Initiated Contact with Patient 05/11/14 1119     Chief Complaint  Patient presents with  . Facial Droop  . Arm Pain     (Consider location/radiation/quality/duration/timing/severity/associated sxs/prior Treatment) HPI Karina [name] is a 33 y.o. female who presents with  Chief Complaint  Patient presents with  . Facial Droop  . Arm Pain   with history of kidney damage and smoking, c/o sudden onset right sided facial numbness and droop, associated with right arm numbness and weakness after pt woke around 8AM this morning. Pt states the right side of her tongue feels like it is burning, as if she ate a lot of sour candy. States she attempted to swish Listerine to get rid of the sensation but was unable to close her lips tight enough to keep mouthwash in her mouth.  States she also has mild burred vision out of her right eye.  Reports mild aching pain in right side of neck and shoulder that started 2 days ago but states numbness and weakness started this morning. Denies recent trauma or falls. Denies recent illness including fever, cough, congestion, n/v/d. Denies hx of similar symptoms. Denies any other significant PMH.    Past Medical History  Diagnosis Date  . 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 MRSA (methicillin resistant Staphylococcus aureus)     (labial, and posterior thigh)  . Smoker    Past Surgical History  Procedure Laterality Date  . Kidney surgery  2007    TO REMOVE BLO)D CLOT   Family History  Problem Relation Age of Onset  . Cancer Mother     vulvar cancer  . Heart disease Neg Hx   . Stroke Neg Hx    History  Substance Use Topics  . Smoking status: Current Every Day Smoker -- 1.00 packs/day    Types: Cigarettes  . Smokeless tobacco: Never Used  . Alcohol Use: Yes     Comment: very rarely.   OB History   Grav Para Term Preterm Abortions TAB SAB Ect Mult  Living       Review of Systems  Constitutional: Negative for fever and chills.  Respiratory: Negative for cough and shortness of breath.   Cardiovascular: Negative for chest pain and palpitations.  Gastrointestinal: Negative for nausea, vomiting, abdominal pain and diarrhea.  Genitourinary: Negative for dysuria, urgency, hematuria and flank pain.  Musculoskeletal: Positive for myalgias and neck pain ( right side of neck/shoulder). Negative for back pain and neck stiffness.  Neurological: Positive for weakness ( right side of face, right arm) and numbness ( right side face, right arm ).  All other systems reviewed and are negative.     Allergies  Review of patient's allergies indicates no known allergies.  Home Medications   Prior to Admission medications   Medication Sig Start Date End Date Taking? Authorizing Provider  ibuprofen (ADVIL,MOTRIN) 200 MG tablet Take 600 mg by mouth every 6 (six) hours as needed for moderate pain.   Yes Historical Provider, MD  omeprazole (PRILOSEC) 20 MG capsule Take 20 mg by mouth daily.   Yes Historical Provider, MD   BP 111/75  Pulse 92  Temp(Src) 98.3 F (36.8 C) (Oral)  Resp 18  Wt 186 lb (84.369 kg)  SpO2 100%  LMP 04/22/2014 Physical Exam  Nursing note and  vitals reviewed. Constitutional: She is oriented to person, place, and time. She appears well-developed and well-nourished. No distress.  HENT:  Head: Normocephalic and atraumatic.  Eyes: Conjunctivae are normal. No scleral icterus.  Neck: Normal range of motion.  Cardiovascular: Normal rate, regular rhythm and normal heart sounds.   Pulmonary/Chest: Effort normal and breath sounds normal. No respiratory distress. She has no wheezes. She has no rales. She exhibits no tenderness.  Abdominal: Soft. Bowel sounds are normal. She exhibits no distension and no mass. There is no tenderness. There is no rebound and no guarding.  Musculoskeletal: Normal range of motion.   Neurological: She is alert and oriented to person, place, and time. A cranial nerve deficit and sensory deficit ( right side of forehead, cheek and right arm) is present. She displays a negative Romberg sign. Coordination and gait normal. GCS eye subscore is 4. GCS verbal subscore is 5. GCS motor subscore is 6.  Right sided facial droop with decreased movement in right eyebrow, right cheek, tongue appears to protrude to left, 4/5 right arm strength compared to left. Decreased sensation in right side of face and arm. Normal coordination and gait. Speech is fluent and goal oriented. Not slurred.   Skin: Skin is warm and dry. She is not diaphoretic.    ED Course  Procedures (including critical care time) Labs Review Labs Reviewed  CBC WITH DIFFERENTIAL  BASIC METABOLIC PANEL  POC URINE PREG, ED    Imaging Review Ct Head Wo Contrast  05/11/2014   CLINICAL DATA:  33 year old female with a history of right arm and face weakness  EXAM: CT HEAD WITHOUT CONTRAST  TECHNIQUE: Contiguous axial images were obtained from the base of the skull through the vertex without intravenous contrast.  COMPARISON:  None.  FINDINGS: Unremarkable appearance of the skull and visualized facial bones. No acute fracture identified. No aggressive or destructive lesions identified. Mastoid cells remain aerated.  Soft tissue density at the left vertex of the skull overlying the parietal bone, may represent a sebaceous cyst.  Unremarkable appearance of the visualized orbits.  No acute intracranial hemorrhage. No midline shift or mass effect. Gray-white differentiation maintained without evidence of acute ischemia.  IMPRESSION: No CT evidence of acute intracranial abnormality.  These results were called by telephone at the time of interpretation on 05/11/2014 at 12:47 pm to Dr. Silverio Lay , who verbally acknowledged these results.  Signed,  Yvone Neu. Loreta Ave, DO  [facility]  [facility]    Electronically Signed   By: Gilmer Mor O.D.   On: 05/11/2014 12:47     EKG Interpretation   Date/Time:  Thursday May 11 2014 11:11:42 EDT Ventricular Rate:  92 PR Interval:  118 QRS Duration: 84 QT Interval:  342 QTC Calculation: 422 R Axis:   37 Text Interpretation:  Normal sinus rhythm Normal ECG No significant change  since last tracing Confirmed by YAO  MD, DAVID ([id number]) on 05/11/2014  11:49:46 AM      MDM   Final diagnoses:  None    Pt c/o right sided facial and right arm numbness and weakness. On exam, right sided facial findings consistent with Bell's Palsy, however, right arm weakness and numbness, atypical. Discussed pt with Dr. Silverio Lay who also examined pt.  CT head obtained, unremarkable. Will consult with neurology for further assistance with pt tx and disposition.   Consulted with neurology who also examined pt.  Lower concern for CVA as pt exhibits a C5-C6 distribution of symptoms that  come and go.  Possibly due to a pinched cervical nerve, however, due to risk of potential CVA, will continue with MRI brain.  If unremarkable, pt may be discharged home with tx for Bell's Palsy.    4:21 PM Pt signed out to Junious Silk, PA-C at shift change. First dose of acyclivir and prednisone given in ED.  See plan above.   Junius Finner, PA-C 05/11/14 1622

## 2014-05-11 NOTE — Consult Note (Signed)
Referring Physician: Silverio Lay    Chief Complaint: Right facial droop  HPI:                                                                                                                                         Karina [name] is an 33 y.o. female who woke up yesterday and felt as if her right shoulder region had decreased sensation.  This cleared and has not returned.  This AM she awoke and while brushing her teeth noted she had a right facial droop. In addition while eating breakfast she noted she had decreased taste on th right aspect of her tongue. She denies any dysarthria or dysphagia, blurred or lack of vision. She denies any hyperacusis of her right ear.   Date last known well: Date: 05/11/2014 Time last known well: Unable to determine tPA Given: No: out of window  Past Medical History  Diagnosis Date  . 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 MRSA (methicillin resistant Staphylococcus aureus)     (labial, and posterior thigh)  . Smoker     Past Surgical History  Procedure Laterality Date  . Kidney surgery  2007    TO REMOVE BLO)D CLOT    Family History  Problem Relation Age of Onset  . Cancer Mother     vulvar cancer  . Heart disease Neg Hx   . Stroke Neg Hx    Social History:  reports that she has been smoking Cigarettes.  She has been smoking about 1.00 pack per day. She has never used smokeless tobacco. She reports that she drinks alcohol. She reports that she does not use illicit drugs.  Allergies: No Known Allergies  Medications:                                                                                                                           No current facility-administered medications for this encounter.   Current Outpatient Prescriptions  Medication Sig Dispense Refill  . ibuprofen (ADVIL,MOTRIN) 200 MG tablet Take 600 mg by mouth every 6 (six) hours as needed for moderate pain.      Marland Kitchen omeprazole (PRILOSEC) 20 MG capsule Take 20 mg  by mouth daily.         ROS:  History obtained from the patient  General ROS: negative for - chills, fatigue, fever, night sweats, weight gain or weight loss Psychological ROS: negative for - behavioral disorder, hallucinations, memory difficulties, mood swings or suicidal ideation Ophthalmic ROS: negative for - blurry vision, double vision, eye pain or loss of vision ENT ROS: negative for - epistaxis, nasal discharge, oral lesions, sore throat, tinnitus or vertigo Allergy and Immunology ROS: negative for - hives or itchy/watery eyes Hematological and Lymphatic ROS: negative for - bleeding problems, bruising or swollen lymph nodes Endocrine ROS: negative for - galactorrhea, hair pattern changes, polydipsia/polyuria or temperature intolerance Respiratory ROS: negative for - cough, hemoptysis, shortness of breath or wheezing Cardiovascular ROS: negative for - chest pain, dyspnea on exertion, edema or irregular heartbeat Gastrointestinal ROS: negative for - abdominal pain, diarrhea, hematemesis, nausea/vomiting or stool incontinence Genito-Urinary ROS: negative for - dysuria, hematuria, incontinence or urinary frequency/urgency Musculoskeletal ROS: negative for - joint swelling or muscular weakness Neurological ROS: as noted in HPI Dermatological ROS: negative for rash and skin lesion changes  Neurologic Examination:                                                                                                      Blood pressure 97/83, pulse 85, temperature 98.6 F (37 C), temperature source Oral, resp. rate 16, weight 84.369 kg (186 lb), last menstrual period 04/22/2014, SpO2 98.00%.   General: NAD Mental Status: Alert, oriented, thought content appropriate.  Speech fluent without evidence of aphasia.  Able to follow 3 step commands without  difficulty. Cranial Nerves: II: Discs flat bilaterally; Visual fields grossly normal, pupils equal, round, reactive to light and accommodation III,IV, VI: ptosis not present, extra-ocular motions intact bilaterally V,VII: smile asymmetric on the right, cannot fully close her right eyelid, has decreased furrowing of right forehead. facial light touch sensation normal bilaterally VIII: hearing normal bilaterally IX,X: gag reflex present XI: bilateral shoulder shrug XII: midline tongue extension without atrophy or fasciculations  Motor: Right : Upper extremity   5/5    Left:     Upper extremity   5/5  Lower extremity   5/5     Lower extremity   5/5 Tone and bulk:normal tone throughout; no atrophy noted Sensory: Pinprick and light touch intact throughout, bilaterally Deep Tendon Reflexes:  Right: Upper Extremity   Left: Upper extremity   biceps (C-5 to C-6) 2/4   biceps (C-5 to C-6) 2/4 tricep (C7) 2/4    triceps (C7) 2/4 Brachioradialis (C6) 2/4  Brachioradialis (C6) 2/4  Lower Extremity Lower Extremity  quadriceps (L-2 to L-4) 2/4   quadriceps (L-2 to L-4) 2/4 Achilles (S1) 2/4   Achilles (S1) 2/4  Plantars: Right: downgoing   Left: downgoing Cerebellar: normal finger-to-nose,  normal heel-to-shin test Gait: not tested.  CV: pulses palpable throughout    Lab Results: Basic Metabolic Panel: No results found for this basename: NA, K, CL, CO2, GLUCOSE, BUN, CREATININE, CALCIUM, MG, PHOS,  in the last 168 hours  Liver Function Tests: No results found for this basename: AST, ALT, ALKPHOS, BILITOT, PROT, ALBUMIN,  in the last 168 hours No results found for this basename: LIPASE, AMYLASE,  in the last 168 hours No results found for this basename: AMMONIA,  in the last 168 hours  CBC:  Recent Labs Lab 05/11/14 1202  WBC 6.5  NEUTROABS 4.6  HGB 13.1  HCT 38.3  MCV 80.1  PLT 310    Cardiac Enzymes: No results found for this basename: CKTOTAL, CKMB, CKMBINDEX, TROPONINI,   in the last 168 hours  Lipid Panel: No results found for this basename: CHOL, TRIG, HDL, CHOLHDL, VLDL, LDLCALC,  in the last 168 hours  CBG: No results found for this basename: GLUCAP,  in the last 168 hours  Microbiology: Results for orders placed during the hospital encounter of 05/07/12  CULTURE, ROUTINE-ABSCESS     Status: None   Collection Time    05/07/12  9:13 AM      Result Value Ref Range Status   Specimen Description ABSCESS   Final   Special Requests LEFT OUTER LABIA   Final   Gram Stain     Final   Value: NO WBC SEEN     NO SQUAMOUS EPITHELIAL CELLS SEEN     FEW GRAM POSITIVE COCCI IN PAIRS   Culture     Final   Value: MODERATE METHICILLIN RESISTANT STAPHYLOCOCCUS AUREUS     Note: RIFAMPIN AND GENTAMICIN SHOULD NOT BE USED AS SINGLE DRUGS FOR TREATMENT OF STAPH INFECTIONS. This organism DOES NOT demonstrate inducible Clindamycin resistance in vitro. CRITICAL RESULT CALLED TO, READ BACK BY AND VERIFIED WITH: MARINA 05/09/12      1030 BY SMITJ   Report Status 05/10/2012 FINAL   Final   Organism ID, Bacteria METHICILLIN RESISTANT STAPHYLOCOCCUS AUREUS   Final    Coagulation Studies: No results found for this basename: LABPROT, INR,  in the last 72 hours  Imaging: Ct Head Wo Contrast  05/11/2014   CLINICAL DATA:  33 year old female with a history of right arm and face weakness  EXAM: CT HEAD WITHOUT CONTRAST  TECHNIQUE: Contiguous axial images were obtained from the base of the skull through the vertex without intravenous contrast.  COMPARISON:  None.  FINDINGS: Unremarkable appearance of the skull and visualized facial bones. No acute fracture identified. No aggressive or destructive lesions identified. Mastoid cells remain aerated.  Soft tissue density at the left vertex of the skull overlying the parietal bone, may represent a sebaceous cyst.  Unremarkable appearance of the visualized orbits.  No acute intracranial hemorrhage. No midline shift or mass effect. Gray-white  differentiation maintained without evidence of acute ischemia.  IMPRESSION: No CT evidence of acute intracranial abnormality.  These results were called by telephone at the time of interpretation on 05/11/2014 at 12:47 pm to Dr. Silverio Lay , who verbally acknowledged these results.  Signed,  Yvone Neu. Loreta Ave, DO  [facility]  [facility]   Electronically Signed   By: Gilmer Mor O.D.   On: 05/11/2014 12:47       Assessment and plan discussed with with attending physician and they are in agreement.    Felicie Morn PA-C [facility] [phone]  05/11/2014, 1:15 PM   Assessment: 33 y.o. female with new onset of right facial droop, inability to close her right eye or furrow right forehead. CT head is normal and shows no acute infarct. 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  Stroke Risk Factors - none  Recommend: 1) MRI brain  --if negative no further stroke work up. If negative consider Prednisone 60 mg taper at discharge  --If positive for stroke patient will need to be admitted by internal medicine and stroke work up.     Patient seen and examined together with physician assistant and I concur with the assessment and plan.  Wyatt Portela, MD

## 2014-05-11 NOTE — ED Provider Notes (Signed)
I was available for consultation during the completion of this patient's care. Please see the initial providers' notes for the encounter details.  Gerhard Munch, MD 05/11/14 2222

## 2017-01-14 ENCOUNTER — Other Ambulatory Visit: Payer: Self-pay | Admitting: Obstetrics and Gynecology

## 2017-01-14 DIAGNOSIS — E049 Nontoxic goiter, unspecified: Secondary | ICD-10-CM

## 2017-01-23 ENCOUNTER — Other Ambulatory Visit: Payer: Self-pay

## 2017-01-30 ENCOUNTER — Ambulatory Visit
Admission: RE | Admit: 2017-01-30 | Discharge: 2017-01-30 | Disposition: A | Discharge status: Home / Self Care | Payer: BLUE CROSS/BLUE SHIELD | Source: Ambulatory Visit | Attending: Obstetrics and Gynecology | Admitting: Obstetrics and Gynecology

## 2017-01-30 DIAGNOSIS — E049 Nontoxic goiter, unspecified: Secondary | ICD-10-CM

## 2018-01-15 ENCOUNTER — Other Ambulatory Visit: Payer: Self-pay | Admitting: Obstetrics and Gynecology

## 2018-01-15 DIAGNOSIS — E010 Iodine-deficiency related diffuse (endemic) goiter: Secondary | ICD-10-CM

## 2018-01-19 ENCOUNTER — Ambulatory Visit
Admission: RE | Admit: 2018-01-19 | Discharge: 2018-01-19 | Disposition: A | Discharge status: Home / Self Care | Payer: BLUE CROSS/BLUE SHIELD | Source: Ambulatory Visit | Attending: Obstetrics and Gynecology | Admitting: Obstetrics and Gynecology

## 2018-01-19 ENCOUNTER — Other Ambulatory Visit: Payer: BLUE CROSS/BLUE SHIELD

## 2018-01-19 DIAGNOSIS — E010 Iodine-deficiency related diffuse (endemic) goiter: Secondary | ICD-10-CM

## 2018-08-29 ENCOUNTER — Encounter (HOSPITAL_COMMUNITY): Payer: Self-pay | Admitting: Emergency Medicine

## 2018-08-29 ENCOUNTER — Ambulatory Visit (HOSPITAL_COMMUNITY)
Admission: EM | Admit: 2018-08-29 | Discharge: 2018-08-29 | Disposition: A | Discharge status: Home / Self Care | Payer: BLUE CROSS/BLUE SHIELD | Attending: Family Medicine | Admitting: Family Medicine

## 2018-08-29 DIAGNOSIS — K047 Periapical abscess without sinus: Secondary | ICD-10-CM | POA: Diagnosis not present

## 2018-08-29 MED ORDER — AMOXICILLIN-POT CLAVULANATE 875-125 MG PO TABS: 1.0000 | ORAL_TABLET | Freq: Two times a day (BID) | ORAL | 0 refills | Status: AC

## 2018-08-29 MED ORDER — FLUCONAZOLE 150 MG PO TABS: 150.0000 mg | ORAL_TABLET | Freq: Once | ORAL | 0 refills | Status: AC

## 2018-08-29 MED ORDER — HYDROCODONE-ACETAMINOPHEN 5-325 MG PO TABS: 1.0000 | ORAL_TABLET | Freq: Four times a day (QID) | ORAL | 0 refills | Status: DC | PRN

## 2018-08-29 NOTE — ED Triage Notes (Signed)
Pt sts left sided dental pain with swelling x 2 days

## 2018-08-29 NOTE — Discharge Instructions (Signed)

## 2018-08-30 NOTE — ED Provider Notes (Signed)
[facility]    CSN: [id number] Arrival date & time: 08/29/18  1015     History   Chief Complaint Chief Complaint  Patient presents with  . Dental Pain    HPI Karina [name] is a 37 y.o. female history of tobacco use, presenting today for evaluation of facial swelling and dental pain.  Patient states that symptoms have been going on for approximately 2 days.  She has noticed swelling to her left upper jaw.  Patient states that in the past she had a root canal gone wrong.  Since she has had occasional issues with this tooth.  Notes that it is her second to last tooth that has caused her issues.  States that her dental insurance does not start until January.  She denies any fever.  Denies any neck stiffness or difficulty moving neck.  Denies any difficulty swallowing.  She has been taking Tylenol and ibuprofen without relief.  HPI  Past Medical History:  Diagnosis Date  . Kidney damage    Kidney blockage resulting in "30% damage"  . MRSA (methicillin resistant Staphylococcus aureus)    (labial, and posterior thigh)  . Smoker     Patient Active Problem List   Diagnosis Date Noted  . GERD (gastroesophageal reflux disease) 12/20/2012  . Tobacco use disorder 12/20/2012  . Obesity (BMI 30-39.9) 12/20/2012    Past Surgical History:  Procedure Laterality Date  . KIDNEY SURGERY  2007   TO REMOVE BLO)D CLOT    OB History    Gravida  0   Para  0   Term  0   Preterm  0   AB  0   Living  0     SAB  0   TAB  0   Ectopic  0   Multiple  0   Live Births               Home Medications    Prior to Admission medications   Medication Sig Start Date End Date Taking? Authorizing Provider  amoxicillin-clavulanate (AUGMENTIN) 875-125 MG tablet Take 1 tablet by mouth every 12 (twelve) hours for 7 days. 08/29/18 09/05/18  Marjorie Lussier C, PA-C  HYDROcodone-acetaminophen (NORCO/VICODIN) 5-325 MG tablet Take 1 tablet by mouth every 6 (six) hours as needed  for severe pain. 08/29/18   Kanoe Wanner C, PA-C  ibuprofen (ADVIL,MOTRIN) 200 MG tablet Take 600 mg by mouth every 6 (six) hours as needed for moderate pain.    [provider]  omeprazole (PRILOSEC) 20 MG capsule Take 20 mg by mouth daily.    [provider]    Family History Family History  Problem Relation Age of Onset  . Cancer Mother        vulvar cancer  . Heart disease Neg Hx   . Stroke Neg Hx     Social History Social History   Tobacco Use  . Smoking status: Current Every Day Smoker    Packs/day: 1.00    Types: Cigarettes  . Smokeless tobacco: Never Used  Substance Use Topics  . Alcohol use: Yes    Comment: very rarely.  . Drug use: No     Allergies   Patient has no known allergies.   Review of Systems Review of Systems  Constitutional: Negative for activity change, appetite change, chills, fatigue and fever.  HENT: Positive for dental problem and facial swelling. Negative for congestion, ear pain, rhinorrhea, sinus pressure, sore throat and trouble swallowing.   Eyes: Negative  for discharge and redness.  Respiratory: Negative for cough, chest tightness and shortness of breath.   Cardiovascular: Negative for chest pain.  Gastrointestinal: Negative for abdominal pain, diarrhea, nausea and vomiting.  Musculoskeletal: Negative for myalgias.  Skin: Negative for rash.  Neurological: Negative for dizziness, light-headedness and headaches.     Physical Exam Triage Vital Signs ED Triage Vitals [08/29/18 1054]  Enc Vitals Group     BP 119/85     Pulse Rate 99     Resp 18     Temp 98.1 F (36.7 C)     Temp Source Oral     SpO2 97 %     Weight      Height      Head Circumference      Peak Flow      Pain Score 10     Pain Loc      Pain Edu?      Excl. in GC?    No data found.  Updated Vital Signs BP 119/85 (BP Location: Left Arm)   Pulse 99   Temp 98.1 F (36.7 C) (Oral)   Resp 18   SpO2 97%   Visual Acuity Right Eye  Distance:   Left Eye Distance:   Bilateral Distance:    Right Eye Near:   Left Eye Near:    Bilateral Near:     Physical Exam Vitals signs and nursing note reviewed.  Constitutional:      General: She is not in acute distress.    Appearance: She is well-developed.  HENT:     Head: Normocephalic and atraumatic.     Comments: Left maxillary area with significant swelling compared to right, tender to palpation    Ears:     Comments: Bilateral ears without tenderness to palpation of external auricle, tragus and mastoid, EAC's without erythema or swelling, TM's with good bony landmarks and cone of light. Non erythematous.    Mouth/Throat:     Comments: Increased erythema and swelling to gingiva around second to last posterior molar on left upper jaw, tender to touch, no soft palate swelling, no uvula swelling or deviation, posterior pharynx patent, nontender to palpation of soft palate below the tongue Eyes:     Conjunctiva/sclera: Conjunctivae normal.  Neck:     Musculoskeletal: Neck supple.     Comments: Full active range of motion of neck, no overlying erythema or swelling Cardiovascular:     Rate and Rhythm: Normal rate and regular rhythm.     Heart sounds: No murmur.  Pulmonary:     Effort: Pulmonary effort is normal. No respiratory distress.     Breath sounds: Normal breath sounds.  Abdominal:     Palpations: Abdomen is soft.     Tenderness: There is no abdominal tenderness.  Skin:    General: Skin is warm and dry.  Neurological:     Mental Status: She is alert.      UC Treatments / Results  Labs (all labs ordered are listed, but only abnormal results are displayed) Labs Reviewed - No data to display  EKG None  Radiology No results found.  Procedures Procedures (including critical care time)  Medications Ordered in UC Medications - No data to display  Initial Impression / Assessment and Plan / UC Course  I have reviewed the triage vital signs and the  nursing notes.  Pertinent labs & imaging results that were available during my care of the patient were reviewed by me and considered in my  medical decision making (see chart for details).     Patient with likely dental abscess.  Will provide Augmentin to treat this.  Tylenol and ibuprofen for mild to moderate pain.  Did provide 2 days worth of hydrocodone to help with pain until antibiotics begin working.  Discussed drowsiness regarding these.  Advised to only use for severe pain at bedtime.  Do not work or drive after taking.  Follow-up with dentistry for more permanent treatment. Final Clinical Impressions(s) / UC Diagnoses   Final diagnoses:  Dental abscess     Discharge Instructions     Please use dental resource to contact offices to seek permenant treatment/relief.   Today we have given you an antibiotic. This should help with pain as any infection is cleared.   For pain please take 600mg -800mg  of Ibuprofen every 8 hours, take with 1000 mg of Tylenol Extra strength every 8 hours. These are safe to take together. Please take with food.   I have also provided 2 days worth of stronger pain medication. This should only be used for severe pain. Do not drive or operate machinery while taking this medication.   Please return if you start to experience significant swelling of your face, experiencing fever.   ED Prescriptions    Medication Sig Dispense Auth. Provider   amoxicillin-clavulanate (AUGMENTIN) 875-125 MG tablet Take 1 tablet by mouth every 12 (twelve) hours for 7 days. 14 tablet Mirren Gest C, PA-C   HYDROcodone-acetaminophen (NORCO/VICODIN) 5-325 MG tablet Take 1 tablet by mouth every 6 (six) hours as needed for severe pain. 8 tablet Derec Mozingo C, PA-C   fluconazole (DIFLUCAN) 150 MG tablet Take 1 tablet (150 mg total) by mouth once for 1 dose. 2 tablet Margart Zemanek C, PA-C     Controlled Substance Prescriptions [place] Controlled Substance Registry consulted? Yes,  I have consulted the [place] Controlled Substances Registry for this patient, and feel the risk/benefit ratio today is favorable for proceeding with this prescription for a controlled substance.   Lew DawesWieters, Keivon Garden C, [place] JerseyPA-C 08/30/18 1905

## 2019-01-18 DIAGNOSIS — N63 Unspecified lump in unspecified breast: Secondary | ICD-10-CM | POA: Diagnosis not present

## 2019-01-18 DIAGNOSIS — Z6838 Body mass index (BMI) 38.0-38.9, adult: Secondary | ICD-10-CM | POA: Diagnosis not present

## 2019-01-18 DIAGNOSIS — Z01419 Encounter for gynecological examination (general) (routine) without abnormal findings: Secondary | ICD-10-CM | POA: Diagnosis not present

## 2019-01-18 DIAGNOSIS — E559 Vitamin D deficiency, unspecified: Secondary | ICD-10-CM | POA: Diagnosis not present

## 2019-01-26 ENCOUNTER — Telehealth: Payer: Self-pay | Admitting: Neurology

## 2019-01-26 NOTE — Telephone Encounter (Signed)
Pt gave consent for VV on the phone/ Pt understands that although there may be some limitations with this type of visit, we will take all precautions to reduce any security or privacy concerns.  Pt understands that this will be treated like an in office visit and we will file with pt's insurance, and there may be a patient responsible charge related to this service. Sent email with link to [email or web address]

## 2019-02-09 ENCOUNTER — Encounter: Payer: Self-pay | Admitting: Neurology

## 2019-02-09 NOTE — Addendum Note (Signed)
Addended by: Lester Fairplains A on: 02/09/2019 01:24 PM   Modules accepted: Orders

## 2019-02-09 NOTE — Telephone Encounter (Signed)
I called pt to update her chart. Pt reports that she would prefer to delay her appt. New appt scheduled for 03/17/19 at 2:30pm. Pt verbalized understanding of new appt date and time.

## 2019-02-10 ENCOUNTER — Ambulatory Visit: Payer: BLUE CROSS/BLUE SHIELD | Admitting: Neurology

## 2019-03-17 ENCOUNTER — Other Ambulatory Visit: Payer: Self-pay

## 2019-03-17 ENCOUNTER — Ambulatory Visit (INDEPENDENT_AMBULATORY_CARE_PROVIDER_SITE_OTHER): Payer: BC Managed Care – PPO | Admitting: Neurology

## 2019-03-17 ENCOUNTER — Encounter: Payer: Self-pay | Admitting: Neurology

## 2019-03-17 VITALS — BP 133/67 | HR 73 | Ht 59.0 in | Wt 187.0 lb

## 2019-03-17 DIAGNOSIS — Z8669 Personal history of other diseases of the nervous system and sense organs: Secondary | ICD-10-CM

## 2019-03-17 DIAGNOSIS — Q8501 Neurofibromatosis, type 1: Secondary | ICD-10-CM | POA: Diagnosis not present

## 2019-03-17 NOTE — Progress Notes (Signed)
Subjective:    Patient ID: Karina [name] is a 38 y.o. female.  HPI     Huston FoleySaima Yifan Auker, MD, PhD [name] [address], [address] [address] [place], [place] [id number]  Dear Dr. [name], I saw your patient, Karina BonesMarcia [name], upon your kind request in my neurologic clinic today for initial consultation of her history of neurofibromatosis.  The patient is unaccompanied today.  As you know, Ms. 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  She has never had genetic testing, she has never seen a specialist for this other than when she was a child, she reports that she was originally diagnosed when she was about 38 years old and started her menarche.  She reports that her mother was diagnosed with it and her maternal grandmother also.  She has no history of seizures, she had not had any neurofibromas removed, she has developed these over time, some of them are uncomfortable if they are at places where her clothes pushed on such as under her bra strap.  She has no neurological symptoms such as weakness or numbness or severe pain, no recurrent headaches, no history of seizures she had Bell's palsy some 5 years ago and has some residual weakness on the right side of her face and when she gets stressed out she has some twitching of the right side of the face.  She smokes about a pack per day, she was able to stop while on Chantix but then she ran out of her Chantix.  She has recently been diagnosed with lower vitamin D but has not started her prescription vitamin D yet.  She lives with her husband, they have no children.  She reports that her sister is negative for NF1 and sister has 2 young children, ages 4013 and 534, neither 1 with symptoms.  The patient knows that this is an autosomal dominant disease.  She reports  that her maternal grandmother had it but grandmother sister did not, mother has the disease both maternal aunt does not and interestingly the patient has it and patient's sister does not.  She works at [employer].  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  She drinks caffeine in the form of coffee, 1 cup/day and tea, 2 cups/day on average.  She drinks alcohol rarely, she has 3 pets in the house, 2 cats and 1 dog.   Her Past Medical History Is Significant For: Past Medical History:  Diagnosis Date  . Kidney damage    Kidney blockage resulting in "30% damage"  . MRSA (methicillin resistant Staphylococcus aureus)    (labial, and posterior thigh)  . Smoker     Her Past Surgical History Is Significant For: Past Surgical History:  Procedure Laterality Date  . KIDNEY SURGERY  2007   TO REMOVE BLO)D CLOT    Her Family History Is Significant For: Family History  Problem Relation Age of Onset  . Cancer Mother        vulvar cancer  . Heart disease Neg Hx   . Stroke Neg Hx     Her Social History Is Significant For: Social History   Socioeconomic History  . Marital status: Married    Spouse name: Not on file  . Number of children: Not on file  . Years of  education: Not on file  . Highest education level: Not on file  Occupational History  . Occupation: Environmental health practitionermanager and server at [employer]    Employer: [employer]  Social Needs  . Financial resource strain: Not on file  . Food insecurity    Worry: Not on file    Inability: Not on file  . Transportation needs    Medical: Not on file    Non-medical: Not on file  Tobacco Use  . Smoking status: Current Every Day Smoker    Packs/day: 1.00    Types: Cigarettes  . Smokeless tobacco: Never Used  Substance and Sexual Activity  . Alcohol use: Yes    Comment: very rarely.  . Drug use: No  . Sexual activity: Not Currently    Partners: Male    Birth control/protection: None  Lifestyle  .  Physical activity    Days per week: Not on file    Minutes per session: Not on file  . Stress: Not on file  Relationships  . Social Musicianconnections    Talks on phone: Not on file    Gets together: Not on file    Attends religious service: Not on file    Active member of club or organization: Not on file    Attends meetings of clubs or organizations: Not on file    Relationship status: Not on file  Other Topics Concern  . Not on file  Social History Narrative   Lives with fiance, 1 cat, 1 dog    Her Allergies Are:  No Known Allergies:   Her Current Medications Are:  Outpatient Encounter Medications as of 03/17/2019  Medication Sig  . clonazePAM (KLONOPIN) 0.5 MG tablet Take 0.5 mg by mouth 2 (two) times daily.  Marland Kitchen. ibuprofen (ADVIL,MOTRIN) 200 MG tablet Take 600 mg by mouth every 6 (six) hours as needed for moderate pain.  Marland Kitchen. omeprazole (PRILOSEC) 20 MG capsule Take 20 mg by mouth daily.   No facility-administered encounter medications on file as of 03/17/2019.   :   Review of Systems:  Out of a complete 14 point review of systems, all are reviewed and negative with the exception of these symptoms as listed below:    Review of Systems  Neurological:       Pt presents today to discuss her neurofibromatosis. Pt denies seizure activity. Pt reports that she had Bells Palsy in 2015.    Objective:  Neurological Exam  Physical Exam Physical Examination:   Vitals:   03/17/19 1410  BP: 133/67  Pulse: 73    General Examination: The patient is a very pleasant 38 y.o. female in no acute distress. She appears well-developed and well-nourished and well groomed.   HEENT: Normocephalic, atraumatic, pupils are equal, round and reactive to light and accommodation. She wears corrective eyeglasses.  Extraocular tracking is good, Q hearing grossly intact.  Face is slightly asymmetric with mild right lower facial weakness noted, eye closure is possible but overall at baseline she has a  slightly droopy eyelid on the right, no obvious hemifacial spasms are twitching noted.  Speech is clear without dysarthria, no lip, neck or jaw tremor.  No carotid bruits.  Tongue protrudes centrally in palate elevates symmetrically, airway examination reveals mild mouth dryness and mildly small airway entry but no significant airway crowding.Neck is supple with full range of motion. Thyroid does appear to be prominent but not particularly nodular.  Chest: Clear to auscultation without wheezing, rhonchi or crackles noted.  Heart: S1+S2+0, regular and normal  without murmurs, rubs or gallops noted.   Abdomen: Soft, non-tender and non-distended with normal bowel sounds appreciated on auscultation.  Extremities: There is no pitting edema in the distal lower extremities bilaterally. Pedal pulses are intact.  Skin: Warm and dry with Numerous neurofibromas noted including 1 in the face, one in the right palm, and several over both forearms and upper arms, she reports several on her back and under the bra strap. Small size about 5 mm, less than 10 mm in diameter for most of the visible ones.   Musculoskeletal: exam reveals no obvious joint deformities, tenderness or joint swelling or erythema.   Neurologically:  Mental status: The patient is awake, alert and oriented in all 4 spheres. Her immediate and remote memory, attention, language skills and fund of knowledge are appropriate. There is no evidence of aphasia, agnosia, apraxia or anomia. Speech is clear with normal prosody and enunciation. Thought process is linear. Mood is normal and affect is normal.  Cranial nerves II - XII are as described above under HEENT exam. In addition: shoulder shrug is normal with equal shoulder height noted. Motor exam: Normal bulk, strength and tone is noted. There is no drift, tremor or rebound. Romberg is negative. Reflexes are 2+ throughout. Babinski: Toes are flexor bilaterally. Fine motor skills and coordination:  intact with normal finger taps, normal hand movements, normal rapid alternating patting, normal foot taps and normal foot agility.  Cerebellar testing: No dysmetria or intention tremor on finger to nose testing. Heel to shin is unremarkable bilaterally. There is no truncal or gait ataxia.  Sensory exam: intact to light touch, pinprick, vibration, temperature sense in the upper and lower extremities.  Gait, station and balance: She stands easily. No veering to one side is noted. No leaning to one side is noted. Posture is age-appropriate and stance is narrow based. Gait shows normal stride length and normal pace. No problems turning are noted. Tandem walk is unremarkable.   Assessment and Plan:   In summary, SANGEETA YOUSE is a very pleasant 38 y.o.-year old female with an underlying medical history of reflux disease, thyroid enlargement, smoking, history of Bell's palsy, and obesity, whose Hx and exam is in keeping with NF1. She has never had genetic testing and would be interested in pursuing this.  She is never had a brain MRI.  I would like for her to have at least a baseline MRI at this time.  Thankfully, she has no other symptoms other than having generalized neurofibromas which are tolerable.  She has never had to have one removed.  She is advised that we could seek input from neurosurgery if needed.  She has never had any neurological complications thankfully. She has residual weakness from her Bell's palsy on the right side.  She is able to close her eyes fairly well bilaterally.  We will keep her posted as to her MRI results, we will also initiate the process for genetic testing.  I would like to see her back routinely in 1 year, sooner if needed.  I answered all her questions today and she was in agreement.  Thank you very much for allowing me to participate in the care of this nice patient. If I can be of any further assistance to you please do not hesitate to call me at  [phone].  Sincerely,   Star Age, MD, PhD

## 2019-03-17 NOTE — Patient Instructions (Addendum)
Your history and physical exam is in keeping with neurofibromatosis type I and, since you have never had genetic testing, we can pursue this. This Test is done through an outside lab, we will start the process for the send out lab.   Since you have never had a brain MRI, we will proceed with a brain MRI with and without contrast.We will call you with the test results. Thankfully, your neurological exam is otherwise normal with the exception of known mild residual weakness from your Bell's palsy 5 years ago.  If you develop any larger or painful skin tumors, we will have to consult with a neurosurgeon.  I will see you in one year routinely, sooner if needed.

## 2019-03-18 LAB — COMPREHENSIVE METABOLIC PANEL
ALT: 15 IU/L (ref 0–32)
AST: 15 IU/L (ref 0–40)
Albumin/Globulin Ratio: 1.5 (ref 1.2–2.2)
Albumin: 4.1 g/dL (ref 3.8–4.8)
Alkaline Phosphatase: 60 IU/L (ref 39–117)
BUN/Creatinine Ratio: 27 — ABNORMAL HIGH (ref 9–23)
BUN: 17 mg/dL (ref 6–20)
Bilirubin Total: 0.2 mg/dL (ref 0.0–1.2)
CO2: 21 mmol/L (ref 20–29)
Calcium: 9.6 mg/dL (ref 8.7–10.2)
Chloride: 102 mmol/L (ref 96–106)
Creatinine, Ser: 0.64 mg/dL (ref 0.57–1.00)
GFR calc Af Amer: 131 mL/min/{1.73_m2} (ref >59)
GFR calc non Af Amer: 114 mL/min/{1.73_m2} (ref >59)
Globulin, Total: 2.7 g/dL (ref 1.5–4.5)
Glucose: 102 mg/dL — ABNORMAL HIGH (ref 65–99)
Potassium: 5 mmol/L (ref 3.5–5.2)
Sodium: 140 mmol/L (ref 134–144)
Total Protein: 6.8 g/dL (ref 6.0–8.5)

## 2019-03-21 ENCOUNTER — Telehealth: Payer: Self-pay | Admitting: Neurology

## 2019-03-21 NOTE — Telephone Encounter (Signed)
[employer] Auth: [id number] (exp. 03/21/19 to 06/18/19) order sent to GI. They will reach out to the patient to schedule.

## 2019-03-21 NOTE — Progress Notes (Signed)
Kidney and liver function good, normal electrolytes, pls update pt. Will await br MRI results and call her. Michel Bickers

## 2019-03-21 NOTE — Progress Notes (Signed)
Genetic testing order sent to [facility]. Received a receipt of confirmation.

## 2019-03-22 ENCOUNTER — Telehealth: Payer: Self-pay

## 2019-03-22 NOTE — Telephone Encounter (Signed)
Pt returned my call and I discussed her lab results with her. Pt will let me know if she has not heard from [place] in 2 weeks. Pt verbalized understanding of results and recommendations. Pt had no questions at this time but was encouraged to call back if questions arise.

## 2019-03-22 NOTE — Telephone Encounter (Signed)
I called pt to discuss her lab results. No answer, left a message asking her to call me back. 

## 2019-03-22 NOTE — Telephone Encounter (Signed)
-----   Message from Star Age, MD sent at 03/21/2019  5:30 PM EDT ----- Kidney and liver function good, normal electrolytes, pls update pt. Will await br MRI results and call her. Michel Bickers

## 2019-05-12 NOTE — Telephone Encounter (Signed)
Pt returned my call. She has not completed her genetic testing yet but plans on completing it soon.

## 2019-05-12 NOTE — Telephone Encounter (Signed)
We have not received results from pt's genetic testing. I called pt to discuss. No answer, left a message asking her to call me back.

## 2019-08-29 ENCOUNTER — Ambulatory Visit (HOSPITAL_COMMUNITY)
Admission: EM | Admit: 2019-08-29 | Discharge: 2019-08-29 | Disposition: A | Discharge status: Home / Self Care | Payer: BC Managed Care – PPO | Attending: Family Medicine | Admitting: Family Medicine

## 2019-08-29 ENCOUNTER — Encounter (HOSPITAL_COMMUNITY): Payer: Self-pay | Admitting: Emergency Medicine

## 2019-08-29 ENCOUNTER — Other Ambulatory Visit: Payer: Self-pay

## 2019-08-29 DIAGNOSIS — R1010 Upper abdominal pain, unspecified: Secondary | ICD-10-CM | POA: Diagnosis not present

## 2019-08-29 LAB — COMPREHENSIVE METABOLIC PANEL
ALT: 17 U/L (ref 0–44)
AST: 14 U/L — ABNORMAL LOW (ref 15–41)
Albumin: 3.4 g/dL — ABNORMAL LOW (ref 3.5–5.0)
Alkaline Phosphatase: 48 U/L (ref 38–126)
Anion gap: 9 (ref 5–15)
BUN: 12 mg/dL (ref 6–20)
CO2: 25 mmol/L (ref 22–32)
Calcium: 9.2 mg/dL (ref 8.9–10.3)
Chloride: 104 mmol/L (ref 98–111)
Creatinine, Ser: 0.76 mg/dL (ref 0.44–1.00)
GFR calc Af Amer: >60 mL/min (ref >60)
GFR calc non Af Amer: >60 mL/min (ref >60)
Glucose, Bld: 91 mg/dL (ref 70–99)
Potassium: 4.1 mmol/L (ref 3.5–5.1)
Sodium: 138 mmol/L (ref 135–145)
Total Bilirubin: 0.8 mg/dL (ref 0.3–1.2)
Total Protein: 7 g/dL (ref 6.5–8.1)

## 2019-08-29 LAB — CBC WITH DIFFERENTIAL/PLATELET
Abs Immature Granulocytes: 0.04 10*3/uL (ref 0.00–0.07)
Basophils Absolute: 0 10*3/uL (ref 0.0–0.1)
Basophils Relative: 0 %
Eosinophils Absolute: 0.2 10*3/uL (ref 0.0–0.5)
Eosinophils Relative: 2 %
HCT: 42.9 % (ref 36.0–46.0)
Hemoglobin: 14.5 g/dL (ref 12.0–15.0)
Immature Granulocytes: 1 %
Lymphocytes Relative: 17 %
Lymphs Abs: 1.5 10*3/uL (ref 0.7–4.0)
MCH: 29.2 pg (ref 26.0–34.0)
MCHC: 33.8 g/dL (ref 30.0–36.0)
MCV: 86.3 fL (ref 80.0–100.0)
Monocytes Absolute: 0.8 10*3/uL (ref 0.1–1.0)
Monocytes Relative: 9 %
Neutro Abs: 6.2 10*3/uL (ref 1.7–7.7)
Neutrophils Relative %: 71 %
Platelets: 286 10*3/uL (ref 150–400)
RBC: 4.97 MIL/uL (ref 3.87–5.11)
RDW: 13.2 % (ref 11.5–15.5)
WBC: 8.7 10*3/uL (ref 4.0–10.5)
nRBC: 0 % (ref 0.0–0.2)

## 2019-08-29 LAB — LIPASE, BLOOD: Lipase: 21 U/L (ref 11–51)

## 2019-08-29 MED ORDER — OMEPRAZOLE 20 MG PO CPDR: 20.0000 mg | DELAYED_RELEASE_CAPSULE | Freq: Every day | ORAL | 1 refills | Status: AC

## 2019-08-29 MED ORDER — ONDANSETRON 4 MG PO TBDP: 4.0000 mg | ORAL_TABLET | Freq: Three times a day (TID) | ORAL | 0 refills | Status: DC | PRN

## 2019-08-29 NOTE — Discharge Instructions (Addendum)
I believe that your abdominal pain may be related to your gallbladder I am giving you some Zofran for nausea, vomiting Prescription sent for omeprazole.  You can start taking this twice a day to see if this helps. Avoid spicy, greasy foods If your symptoms worsen you need to go to the ER otherwise I put a contact for primary care for follow-up for ultrasound.

## 2019-08-29 NOTE — ED Triage Notes (Signed)
Nausea, vomiting, diarrhea, intermittent sharp shooting pain. Started Saturday. She had similar symptoms 1.5 months ago. Vomited 3 x in last 24 hours. Diarrhea 4 times in last 24 hours. No one else she lives with is sick.

## 2019-08-30 NOTE — ED Provider Notes (Signed)
[facility]    CSN: [id number] Arrival date & time: 08/29/19  1115      History   Chief Complaint Chief Complaint  Patient presents with  . Appointment  . Emesis  . Nausea    HPI Karina [name] is a 38 y.o. female.   Patient is a 38 year old female that presents today with nausea, vomiting, diarrhea and intermittent sharp shooting pain in the upper abdomen.  This is been present, waxing and waning for the past 4 to 5 days.  She has had similar symptoms in the past and similar episode approximately a month and a half ago.  She has vomited 3 times in the last 24 hours.  Denies any blood in vomit or stool.  She is also had diarrhea 4 times in the last 24 hours.  No recent sick contacts.  Does have a history of GERD but this feels different.  Takes her omeprazole daily.  No chest pain or shortness of breath.  No fever.  ROS per HPI    Emesis   Past Medical History:  Diagnosis Date  . Kidney damage    Kidney blockage resulting in "30% damage"  . MRSA (methicillin resistant Staphylococcus aureus)    (labial, and posterior thigh)  . Smoker     Patient Active Problem List   Diagnosis Date Noted  . GERD (gastroesophageal reflux disease) 12/20/2012  . Tobacco use disorder 12/20/2012  . Obesity (BMI 30-39.9) 12/20/2012    Past Surgical History:  Procedure Laterality Date  . KIDNEY SURGERY  2007   TO REMOVE BLO)D CLOT    OB History    Gravida  0   Para  0   Term  0   Preterm  0   AB  0   Living  0     SAB  0   TAB  0   Ectopic  0   Multiple  0   Live Births               Home Medications    Prior to Admission medications   Medication Sig Start Date End Date Taking? Authorizing Provider  clonazePAM (KLONOPIN) 0.5 MG tablet Take 0.5 mg by mouth 2 (two) times daily.   Yes [provider]  ibuprofen (ADVIL,MOTRIN) 200 MG tablet Take 600 mg by mouth every 6 (six) hours as needed for moderate pain.   Yes [provider]  omeprazole (PRILOSEC) 20 MG capsule Take 1 capsule (20 mg total) by mouth daily. 08/29/19   Dahlia Byes A, NP  ondansetron (ZOFRAN ODT) 4 MG disintegrating tablet Take 1 tablet (4 mg total) by mouth every 8 (eight) hours as needed for nausea or vomiting. 08/29/19   Janace Aris, NP    Family History Family History  Problem Relation Age of Onset  . Cancer Mother        vulvar cancer  . Heart disease Neg Hx   . Stroke Neg Hx     Social History Social History   Tobacco Use  . Smoking status: Current Every Day Smoker    Packs/day: 1.00    Types: Cigarettes  . Smokeless tobacco: Never Used  Substance Use Topics  . Alcohol use: Yes    Comment: very rarely.  . Drug use: No     Allergies   Patient has no known allergies.   Review of Systems Review of Systems  Gastrointestinal: Positive for vomiting.     Physical Exam Triage Vital Signs  ED Triage Vitals  Enc Vitals Group     BP 08/29/19 1146 (!) 111/49     Pulse Rate 08/29/19 1146 76     Resp 08/29/19 1146 16     Temp 08/29/19 1146 99.2 F (37.3 C)     Temp Source 08/29/19 1146 Oral     SpO2 08/29/19 1146 100 %     Weight --      Height --      Head Circumference --      Peak Flow --      Pain Score 08/29/19 1144 0     Pain Loc --      Pain Edu? --      Excl. in [place]? --    No data found.  Updated Vital Signs BP (!) 111/49   Pulse 76   Temp 99.2 F (37.3 C) (Oral)   Resp 16   LMP 08/05/2019   SpO2 100%   Visual Acuity Right Eye Distance:   Left Eye Distance:   Bilateral Distance:    Right Eye Near:   Left Eye Near:    Bilateral Near:     Physical Exam Vitals and nursing note reviewed.  Constitutional:      General: She is not in acute distress.    Appearance: Normal appearance. She is not ill-appearing, toxic-appearing or diaphoretic.  HENT:     Head: Normocephalic.     Nose: Nose normal.     Mouth/Throat:     Pharynx: Oropharynx is clear.  Eyes:     Conjunctiva/sclera: Conjunctivae  normal.  Pulmonary:     Effort: Pulmonary effort is normal.  Abdominal:     General: Abdomen is flat. There is no distension.     Palpations: Abdomen is soft. There is no hepatomegaly or splenomegaly.     Tenderness: There is abdominal tenderness in the right upper quadrant and epigastric area. There is no right CVA tenderness, left CVA tenderness, guarding or rebound. Negative signs include Murphy's sign.     Hernia: No hernia is present.  Musculoskeletal:        General: Normal range of motion.     Cervical back: Normal range of motion.  Skin:    General: Skin is warm and dry.     Findings: No rash.  Neurological:     Mental Status: She is alert.  Psychiatric:        Mood and Affect: Mood normal.      UC Treatments / Results  Labs (all labs ordered are listed, but only abnormal results are displayed) Labs Reviewed  COMPREHENSIVE METABOLIC PANEL - Abnormal; Notable for the following components:      Result Value   Albumin 3.4 (*)    AST 14 (*)    All other components within normal limits  CBC WITH DIFFERENTIAL/PLATELET  LIPASE, BLOOD    EKG   Radiology No results found.  Procedures Procedures (including critical care time)  Medications Ordered in UC Medications - No data to display  Initial Impression / Assessment and Plan / UC Course  I have reviewed the triage vital signs and the nursing notes.  Pertinent labs & imaging results that were available during my care of the patient were reviewed by me and considered in my medical decision making (see chart for details).     Upper abdominal pain-based on symptoms, reoccurrence there is some concern for cholecystitis.  She is stable at this time without significant pain.  Blood work without any significant  abnormality.  Refilled her omeprazole and will have her start this twice a day.  Recommended sip fluids to stay hydrated.  She has not really had any appetite because every time she eats she is very sick so given  the small bland meals.   Zofran as needed for nausea, vomiting She plans to follow-up with her primary care for ultrasound If symptoms worsen it was recommended she go to the ER. Final Clinical Impressions(s) / UC Diagnoses   Final diagnoses:  Pain of upper abdomen     Discharge Instructions     I believe that your abdominal pain may be related to your gallbladder I am giving you some Zofran for nausea, vomiting Prescription sent for omeprazole.  You can start taking this twice a day to see if this helps. Avoid spicy, greasy foods If your symptoms worsen you need to go to the ER otherwise I put a contact for primary care for follow-up for ultrasound.    ED Prescriptions    Medication Sig Dispense Auth. Provider   omeprazole (PRILOSEC) 20 MG capsule Take 1 capsule (20 mg total) by mouth daily. 90 capsule Christian Borgerding A, NP   ondansetron (ZOFRAN ODT) 4 MG disintegrating tablet Take 1 tablet (4 mg total) by mouth every 8 (eight) hours as needed for nausea or vomiting. 20 tablet Dahlia ByesBast, Chani Ghanem A, NP     PDMP not reviewed this encounter.   Janace ArisBast, Jamilia Jacques A, NP 08/30/19 1635

## 2019-09-09 DIAGNOSIS — R1013 Epigastric pain: Secondary | ICD-10-CM | POA: Diagnosis not present

## 2019-09-09 DIAGNOSIS — R1010 Upper abdominal pain, unspecified: Secondary | ICD-10-CM | POA: Diagnosis not present

## 2019-09-09 DIAGNOSIS — Z720 Tobacco use: Secondary | ICD-10-CM | POA: Diagnosis not present

## 2019-09-21 DIAGNOSIS — R1010 Upper abdominal pain, unspecified: Secondary | ICD-10-CM | POA: Diagnosis not present

## 2019-09-27 ENCOUNTER — Other Ambulatory Visit: Payer: Self-pay | Admitting: Family Medicine

## 2019-09-27 DIAGNOSIS — N2889 Other specified disorders of kidney and ureter: Secondary | ICD-10-CM

## 2019-11-01 ENCOUNTER — Other Ambulatory Visit: Payer: Self-pay

## 2019-11-01 ENCOUNTER — Ambulatory Visit
Admission: RE | Admit: 2019-11-01 | Discharge: 2019-11-01 | Disposition: A | Discharge status: Home / Self Care | Payer: BC Managed Care – PPO | Source: Ambulatory Visit | Attending: Family Medicine | Admitting: Family Medicine

## 2019-11-01 DIAGNOSIS — N2889 Other specified disorders of kidney and ureter: Secondary | ICD-10-CM | POA: Diagnosis not present

## 2019-11-01 MED ORDER — GADOBENATE DIMEGLUMINE 529 MG/ML IV SOLN
17.0000 mL | Freq: Once | INTRAVENOUS | Status: AC | PRN
  Administered 2019-11-01: 17 mL via INTRAVENOUS

## 2019-12-13 DIAGNOSIS — M5441 Lumbago with sciatica, right side: Secondary | ICD-10-CM | POA: Diagnosis not present

## 2019-12-13 DIAGNOSIS — M5442 Lumbago with sciatica, left side: Secondary | ICD-10-CM | POA: Diagnosis not present

## 2020-01-19 DIAGNOSIS — N6320 Unspecified lump in the left breast, unspecified quadrant: Secondary | ICD-10-CM | POA: Diagnosis not present

## 2020-01-19 DIAGNOSIS — E042 Nontoxic multinodular goiter: Secondary | ICD-10-CM | POA: Diagnosis not present

## 2020-01-19 DIAGNOSIS — Z6839 Body mass index (BMI) 39.0-39.9, adult: Secondary | ICD-10-CM | POA: Diagnosis not present

## 2020-01-19 DIAGNOSIS — Z01419 Encounter for gynecological examination (general) (routine) without abnormal findings: Secondary | ICD-10-CM | POA: Diagnosis not present

## 2020-01-20 ENCOUNTER — Other Ambulatory Visit: Payer: Self-pay | Admitting: Obstetrics and Gynecology

## 2020-01-20 DIAGNOSIS — N6321 Unspecified lump in the left breast, upper outer quadrant: Secondary | ICD-10-CM

## 2020-01-20 DIAGNOSIS — E049 Nontoxic goiter, unspecified: Secondary | ICD-10-CM

## 2020-02-03 ENCOUNTER — Other Ambulatory Visit: Payer: BC Managed Care – PPO

## 2020-02-14 ENCOUNTER — Other Ambulatory Visit: Payer: BC Managed Care – PPO

## 2020-03-19 ENCOUNTER — Encounter: Payer: Self-pay | Admitting: Neurology

## 2020-03-19 ENCOUNTER — Telehealth: Payer: Self-pay

## 2020-03-19 ENCOUNTER — Ambulatory Visit: Payer: Self-pay | Admitting: Neurology

## 2020-03-19 NOTE — Telephone Encounter (Signed)
Pt no showed 03/19/2020 f/u with Dr. Frances Furbish.

## 2020-03-29 ENCOUNTER — Other Ambulatory Visit: Payer: Self-pay

## 2020-03-29 ENCOUNTER — Ambulatory Visit
Admission: RE | Admit: 2020-03-29 | Discharge: 2020-03-29 | Disposition: A | Discharge status: Home / Self Care | Payer: BC Managed Care – PPO | Source: Ambulatory Visit | Attending: Emergency Medicine | Admitting: Emergency Medicine

## 2020-03-29 VITALS — BP 112/61 | HR 77 | Temp 98.4°F | Resp 18

## 2020-03-29 DIAGNOSIS — M5441 Lumbago with sciatica, right side: Secondary | ICD-10-CM | POA: Diagnosis not present

## 2020-03-29 MED ORDER — TIZANIDINE HCL 2 MG PO CAPS: 2.0000 mg | ORAL_CAPSULE | Freq: Three times a day (TID) | ORAL | 0 refills | Status: DC

## 2020-03-29 MED ORDER — PREDNISONE 10 MG (21) PO TBPK: ORAL_TABLET | Freq: Every day | ORAL | 0 refills | Status: DC

## 2020-03-29 MED ORDER — DEXAMETHASONE SODIUM PHOSPHATE 10 MG/ML IJ SOLN
10.0000 mg | Freq: Once | INTRAMUSCULAR | Status: AC
  Administered 2020-03-29: 10 mg via INTRAMUSCULAR

## 2020-03-29 NOTE — ED Triage Notes (Signed)
Pt states she has right side lower back and sciatic pain for past month, had virtual visit a couple weeks ago and had muscle relaxer's and mobic but no improvement

## 2020-03-29 NOTE — Discharge Instructions (Signed)
Steroid shot given in office Continue conservative management of rest, ice, and gentle stretches Prednisone prescribed.  Take as directed and to completion Take zanaflex as directed for spasm.  Follow up with PCP if symptoms persist. Avoid driving or operating heavy machinery as this medication may make you drowsy Return or go to the ER if you have any new or worsening symptoms (fever, chills, chest pain, abdominal pain, changes in bowel or bladder habits, pain radiating into lower legs, etc...)

## 2020-03-29 NOTE — ED Provider Notes (Signed)
[facility]   [id number] 03/29/20 Arrival Time: 2947  ML:YYTK PAIN  SUBJECTIVE: History from: patient. Karina [name] is a 39 y.o. female complains of RT low back pain that began 1 month ago.  Denies a precipitating event or specific injury.  Localizes the pain to the RT low back.  Describes the pain as intermittent and achy in character.  Has tried mobic and muscle relaxer without relief.  Symptoms are made worse with standing from a seat position or laying position.  Denies similar symptoms in the past.  Denies fever, chills, erythema, ecchymosis, effusion, weakness, numbness and tingling, saddle paresthesias, loss of bowel or bladder function.      ROS: As per HPI.  All other pertinent ROS negative.     Past Medical History:  Diagnosis Date  . Kidney damage    Kidney blockage resulting in "30% damage"  . MRSA (methicillin resistant Staphylococcus aureus)    (labial, and posterior thigh)  . Smoker    Past Surgical History:  Procedure Laterality Date  . KIDNEY SURGERY  2007   TO REMOVE BLO)D CLOT   No Known Allergies No current facility-administered medications on file prior to encounter.   Current Outpatient Medications on File Prior to Encounter  Medication Sig Dispense Refill  . clonazePAM (KLONOPIN) 0.5 MG tablet Take 0.5 mg by mouth 2 (two) times daily.    Marland Kitchen ibuprofen (ADVIL,MOTRIN) 200 MG tablet Take 600 mg by mouth every 6 (six) hours as needed for moderate pain.    Marland Kitchen omeprazole (PRILOSEC) 20 MG capsule Take 1 capsule (20 mg total) by mouth daily. 90 capsule 1   Social History   Socioeconomic History  . Marital status: Married    Spouse name: Not on file  . Number of children: Not on file  . Years of education: Not on file  . Highest education level: Not on file  Occupational History  . Occupation: Environmental health practitioner at [employer]: [employer]  Tobacco Use  . Smoking status: Current Every Day Smoker    Packs/day: 1.00    Types: Cigarettes  .  Smokeless tobacco: Never Used  Substance and Sexual Activity  . Alcohol use: Yes    Comment: very rarely.  . Drug use: No  . Sexual activity: Not Currently    Partners: Male    Birth control/protection: None  Other Topics Concern  . Not on file  Social History Narrative   Lives with fiance, 1 cat, 1 dog   Social Determinants of Health   Financial Resource Strain:   . Difficulty of Paying Living Expenses:   Food Insecurity:   . Worried About Programme researcher, broadcasting/film/video in the Last Year:   . Barista in the Last Year:   Transportation Needs:   . Freight forwarder (Medical):   Marland Kitchen Lack of Transportation (Non-Medical):   Physical Activity:   . Days of Exercise per Week:   . Minutes of Exercise per Session:   Stress:   . Feeling of Stress :   Social Connections:   . Frequency of Communication with Friends and Family:   . Frequency of Social Gatherings with Friends and Family:   . Attends Religious Services:   . Active Member of Clubs or Organizations:   . Attends Banker Meetings:   Marland Kitchen Marital Status:   Intimate Partner Violence:   . Fear of Current or Ex-Partner:   . Emotionally Abused:   Marland Kitchen Physically Abused:   .  Sexually Abused:    Family History  Problem Relation Age of Onset  . Cancer Mother        vulvar cancer  . Heart disease Neg Hx   . Stroke Neg Hx     OBJECTIVE:  Vitals:   03/29/20 0900  BP: (!) 112/61  Pulse: 77  Resp: 18  Temp: 98.4 F (36.9 C)  SpO2: 98%    General appearance: ALERT; in no acute distress.  Head: NCAT Lungs: Normal respiratory effort; CTAB CV: RRR Musculoskeletal: Back Inspection: Skin warm, dry, clear and intact without obvious erythema, effusion, or ecchymosis.  Palpation: TTP over RT low back ROM: FROM active and passive Strength: 5/5 shld abduction, 5/5 shld adduction, 5/5 elbow flexion, 5/5 elbow extension, 5/5 grip strength, 5/5 hip flexion, 5/5 hip extension Skin: warm and dry Neurologic: Ambulates  without difficulty; Sensation intact about the upper/ lower extremities Psychological: alert and cooperative; normal mood and affect   ASSESSMENT & PLAN:  1. Acute right-sided low back pain with right-sided sciatica      Meds ordered this encounter  Medications  . predniSONE (STERAPRED UNI-PAK 21 TAB) 10 MG (21) TBPK tablet    Sig: Take by mouth daily. Take 6 tabs by mouth daily  for 2 days, then 5 tabs for 2 days, then 4 tabs for 2 days, then 3 tabs for 2 days, 2 tabs for 2 days, then 1 tab by mouth daily for 2 days    Dispense:  42 tablet    Refill:  0    Order Specific Question:   Supervising Provider    Answer:   Eustace Moore [[id number]]  . tizanidine (ZANAFLEX) 2 MG capsule    Sig: Take 1 capsule (2 mg total) by mouth 3 (three) times daily.    Dispense:  30 capsule    Refill:  0    Order Specific Question:   Supervising Provider    Answer:   Eustace Moore [[id number]]  . dexamethasone (DECADRON) injection 10 mg   Steroid shot given in office Continue conservative management of rest, ice, and gentle stretches Prednisone prescribed.  Take as directed and to completion Take zanaflex as directed for spasm.  Follow up with PCP if symptoms persist. Avoid driving or operating heavy machinery as this medication may make you drowsy Return or go to the ER if you have any new or worsening symptoms (fever, chills, chest pain, abdominal pain, changes in bowel or bladder habits, pain radiating into lower legs, etc...)   Reviewed expectations re: course of current medical issues. Questions answered. Outlined signs and symptoms indicating need for more acute intervention. Patient verbalized understanding. After Visit Summary given.    Rennis Harding, PA-C 03/29/20 [phone]

## 2020-04-06 ENCOUNTER — Ambulatory Visit
Admission: EM | Admit: 2020-04-06 | Discharge: 2020-04-06 | Disposition: A | Discharge status: Home / Self Care | Payer: BC Managed Care – PPO | Attending: Emergency Medicine | Admitting: Emergency Medicine

## 2020-04-06 ENCOUNTER — Other Ambulatory Visit: Payer: Self-pay

## 2020-04-06 ENCOUNTER — Ambulatory Visit (INDEPENDENT_AMBULATORY_CARE_PROVIDER_SITE_OTHER): Payer: BC Managed Care – PPO

## 2020-04-06 DIAGNOSIS — S99911A Unspecified injury of right ankle, initial encounter: Secondary | ICD-10-CM

## 2020-04-06 DIAGNOSIS — M25571 Pain in right ankle and joints of right foot: Secondary | ICD-10-CM

## 2020-04-06 DIAGNOSIS — S93491A Sprain of other ligament of right ankle, initial encounter: Secondary | ICD-10-CM

## 2020-04-06 MED ORDER — MELOXICAM 15 MG PO TABS
15.0000 mg | ORAL_TABLET | Freq: Every day | ORAL | 0 refills | Status: DC
Start: 2020-04-06 — End: 2023-02-04

## 2020-04-06 MED ORDER — KETOROLAC TROMETHAMINE 60 MG/2ML IM SOLN
60.0000 mg | Freq: Once | INTRAMUSCULAR | Status: AC
  Administered 2020-04-06: 60 mg via INTRAMUSCULAR

## 2020-04-06 NOTE — Discharge Instructions (Signed)
X-rays negative for fracture or dislocation Toradol shot given in offive Continue conservative management of rest, ice, and elevation Take mobic as needed for pain relief (may cause abdominal discomfort, ulcers, and GI bleeds avoid taking with other NSAIDs) Follow up with PCP if symptoms persist Return or go to the ER if you have any new or worsening symptoms (fever, chills, chest pain, redness, swelling, deformity,  etc...)

## 2020-04-06 NOTE — ED Provider Notes (Signed)
[facility]   [id number] 04/06/20 Arrival Time: 1122  CC: RT ankle pain  SUBJECTIVE: History from: patient. Karina [name] is a 39 y.o. female complains of RT ankle pain x 2 days ago.  Trip and fall.  Localizes the pain to the outside of ankle.  Describes the pain as intermittent and sharp in character.  Has tried OTC medications without relief.  Symptoms are made worse with walking.  Denies similar symptoms in the past.  Complains of associated bruising and swelling.  Denies fever, chills, erythema, weakness.   ROS: As per HPI.  All other pertinent ROS negative.     Past Medical History:  Diagnosis Date  . Kidney damage    Kidney blockage resulting in "30% damage"  . MRSA (methicillin resistant Staphylococcus aureus)    (labial, and posterior thigh)  . Smoker    Past Surgical History:  Procedure Laterality Date  . KIDNEY SURGERY  2007   TO REMOVE BLO)D CLOT   No Known Allergies No current facility-administered medications on file prior to encounter.   Current Outpatient Medications on File Prior to Encounter  Medication Sig Dispense Refill  . clonazePAM (KLONOPIN) 0.5 MG tablet Take 0.5 mg by mouth 2 (two) times daily.    Marland Kitchen ibuprofen (ADVIL,MOTRIN) 200 MG tablet Take 600 mg by mouth every 6 (six) hours as needed for moderate pain.    Marland Kitchen omeprazole (PRILOSEC) 20 MG capsule Take 1 capsule (20 mg total) by mouth daily. 90 capsule 1  . predniSONE (STERAPRED UNI-PAK 21 TAB) 10 MG (21) TBPK tablet Take by mouth daily. Take 6 tabs by mouth daily  for 2 days, then 5 tabs for 2 days, then 4 tabs for 2 days, then 3 tabs for 2 days, 2 tabs for 2 days, then 1 tab by mouth daily for 2 days 42 tablet 0  . tizanidine (ZANAFLEX) 2 MG capsule Take 1 capsule (2 mg total) by mouth 3 (three) times daily. 30 capsule 0   Social History   Socioeconomic History  . Marital status: Married    Spouse name: Not on file  . Number of children: Not on file  . Years of education: Not on file    . Highest education level: Not on file  Occupational History  . Occupation: Environmental health practitioner at [employer]: [employer]  Tobacco Use  . Smoking status: Current Every Day Smoker    Packs/day: 1.00    Types: Cigarettes  . Smokeless tobacco: Never Used  Substance and Sexual Activity  . Alcohol use: Yes    Comment: very rarely.  . Drug use: No  . Sexual activity: Not Currently    Partners: Male    Birth control/protection: None  Other Topics Concern  . Not on file  Social History Narrative   Lives with fiance, 1 cat, 1 dog   Social Determinants of Health   Financial Resource Strain:   . Difficulty of Paying Living Expenses:   Food Insecurity:   . Worried About Programme researcher, broadcasting/film/video in the Last Year:   . Barista in the Last Year:   Transportation Needs:   . Freight forwarder (Medical):   Marland Kitchen Lack of Transportation (Non-Medical):   Physical Activity:   . Days of Exercise per Week:   . Minutes of Exercise per Session:   Stress:   . Feeling of Stress :   Social Connections:   . Frequency of Communication with Friends and Family:   .  Frequency of Social Gatherings with Friends and Family:   . Attends Religious Services:   . Active Member of Clubs or Organizations:   . Attends Banker Meetings:   Marland Kitchen Marital Status:   Intimate Partner Violence:   . Fear of Current or Ex-Partner:   . Emotionally Abused:   Marland Kitchen Physically Abused:   . Sexually Abused:    Family History  Problem Relation Age of Onset  . Cancer Mother        vulvar cancer  . Heart disease Neg Hx   . Stroke Neg Hx     OBJECTIVE:  Vitals:   04/06/20 1140  BP: 122/78  Pulse: 76  Resp: 18  Temp: 98.6 F (37 C)  SpO2: 97%    General appearance: ALERT; in no acute distress.  Head: NCAT Lungs: Normal respiratory effort CV: Dorsalis pedis pulse 2+ Musculoskeletal: RT ankle Inspection: Swelling and ecchymosis over lateral ankle Palpation: TTP over lateral malleolus ROM: FROM  active and passive Strength: 5/5 dorsiflexion, 5/5 plantar flexion Skin: warm and dry Neurologic: Ambulates without difficulty; Sensation intact about the lower extremities Psychological: alert and cooperative; normal mood and affect  DIAGNOSTIC STUDIES:  DG Ankle Complete Right  Result Date: 04/06/2020 CLINICAL DATA:  Right ankle pain after fall EXAM: RIGHT ANKLE - COMPLETE 3+ VIEW COMPARISON:  None. FINDINGS: There is no evidence of fracture, dislocation, or joint effusion. There is no evidence of arthropathy or other focal bone abnormality. Soft tissue swelling overlies the lateral malleolus. IMPRESSION: Lateral ankle soft tissue swelling without acute fracture or dislocation. Electronically Signed   By: Duanne Guess D.O.   On: 04/06/2020 11:59    X-rays negative for bony abnormalities including fracture, or dislocation.    I have reviewed the x-rays myself and the radiologist interpretation. I am in agreement with the radiologist interpretation.     ASSESSMENT & PLAN:  1. Pain in joint involving right ankle and foot   2. Sprain of anterior talofibular ligament of right ankle, initial encounter   3. Injury of right ankle, initial encounter     Meds ordered this encounter  Medications  . meloxicam (MOBIC) 15 MG tablet    Sig: Take 1 tablet (15 mg total) by mouth daily.    Dispense:  30 tablet    Refill:  0    Order Specific Question:   Supervising Provider    Answer:   Eustace Moore [[id number]]  . ketorolac (TORADOL) injection 60 mg   X-rays negative for fracture or dislocation Toradol shot given in offive Continue conservative management of rest, ice, and elevation Take mobic as needed for pain relief (may cause abdominal discomfort, ulcers, and GI bleeds avoid taking with other NSAIDs) Follow up with PCP if symptoms persist Return or go to the ER if you have any new or worsening symptoms (fever, chills, chest pain, redness, swelling, deformity,  etc...)    Reviewed  expectations re: course of current medical issues. Questions answered. Outlined signs and symptoms indicating need for more acute intervention. Patient verbalized understanding. After Visit Summary given.    Rennis Harding, PA-C 04/06/20 1220

## 2020-04-06 NOTE — ED Triage Notes (Signed)
Pt presents with right ankle injury and back pain from fall on wednesday , bruising and swelling noted

## 2020-06-13 DIAGNOSIS — L918 Other hypertrophic disorders of the skin: Secondary | ICD-10-CM | POA: Diagnosis not present

## 2020-06-13 DIAGNOSIS — D3617 Benign neoplasm of peripheral nerves and autonomic nervous system of trunk, unspecified: Secondary | ICD-10-CM | POA: Diagnosis not present

## 2020-06-13 DIAGNOSIS — E042 Nontoxic multinodular goiter: Secondary | ICD-10-CM | POA: Diagnosis not present

## 2020-08-09 IMAGING — MR MR ABDOMEN WO/W CM
11 of 17 series · 28 of 48 positions shown · IV contrast (17ml Multihance)
Comparison: Multiple exams, including abdominal ultrasound of
09/21/2019

CLINICAL DATA: Right kidney lower pole complex lesion on 09/21/2019
ultrasound, for further investigation

EXAM:
MRI ABDOMEN WITHOUT AND WITH CONTRAST
TECHNIQUE: Multiplanar multisequence MR imaging of the abdomen was performed
both before and after the administration of intravenous contrast.
CONTRAST:  17mL MULTIHANCE GADOBENATE DIMEGLUMINE 529 MG/ML IV SOLN

[Series 3: cor haste · coronal · 5.0mm · 0.68mm/px · 2 of 30 slices shown]
[im 1/30]
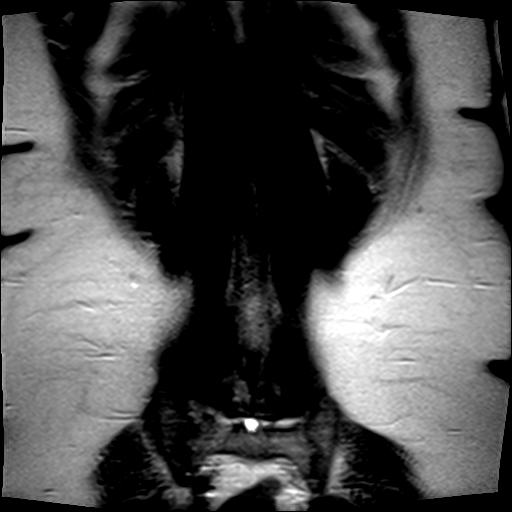
[im 30/30]
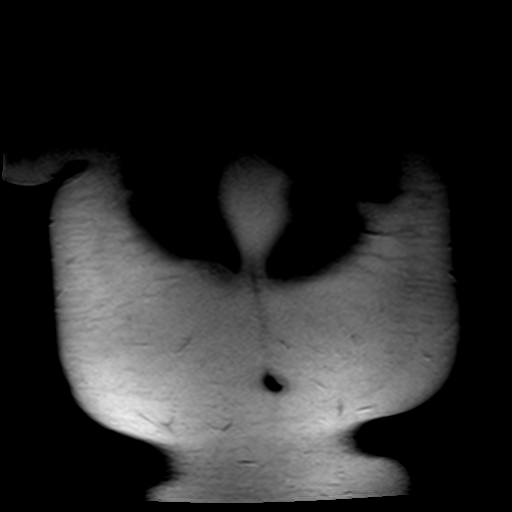

[Series 4: axial haste · axial · 6.0mm · 0.68mm/px · z∈[-42,+170]mm · 2 of 33 slices shown]
[im 1/33]
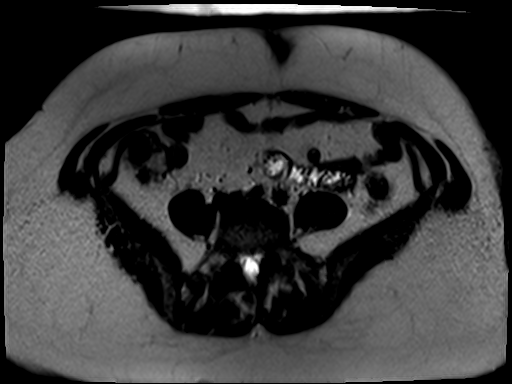
[im 33/33]
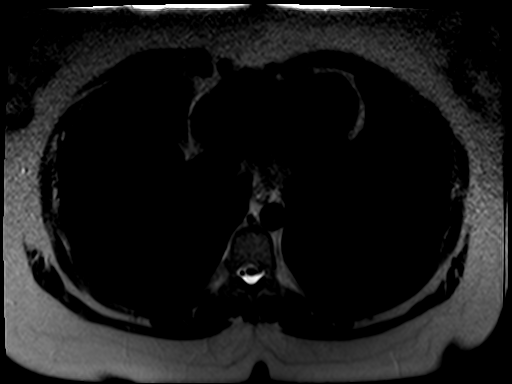

[Series 5: T1 · axial · 6.0mm · 0.68mm/px · z∈[-42,+170]mm · 4 of 66 slices shown]
[im 1/66]
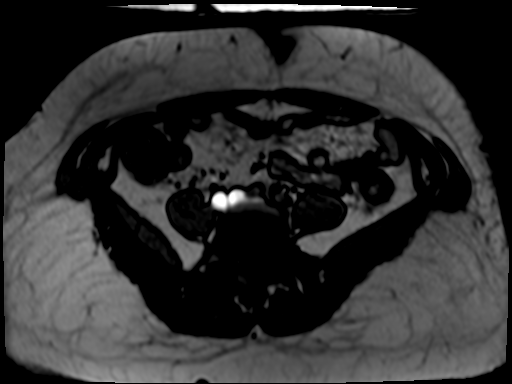
[im 22/66]
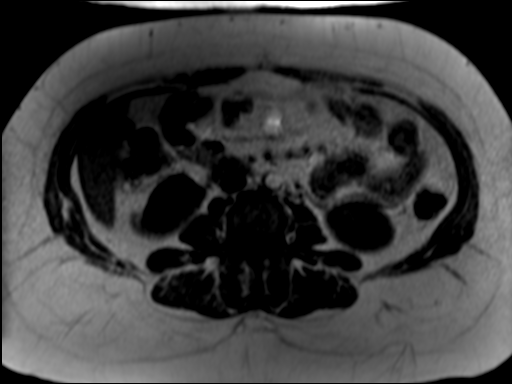
[im 44/66]
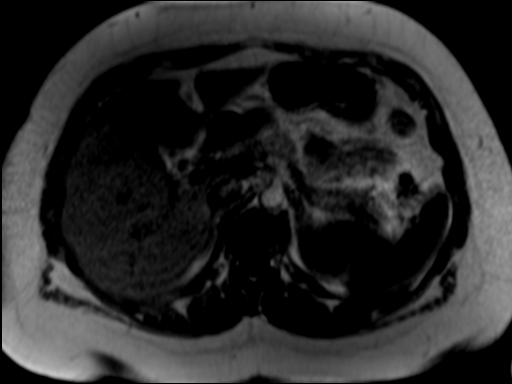
[im 66/66]
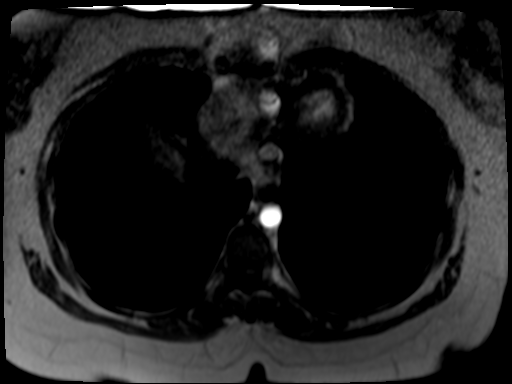

[Series 6: bSSFP · axial · 4.0mm · 0.68mm/px · z∈[-56,+184]mm · 3 of 61 slices shown]
[im 1/61]
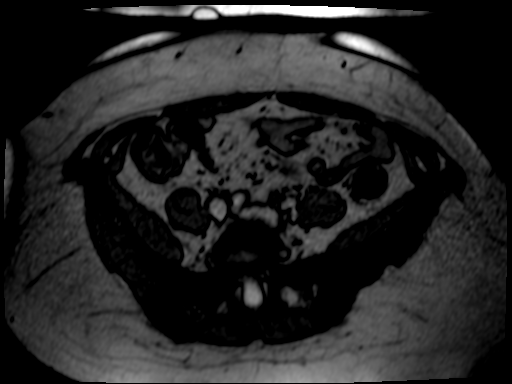
[im 31/61]
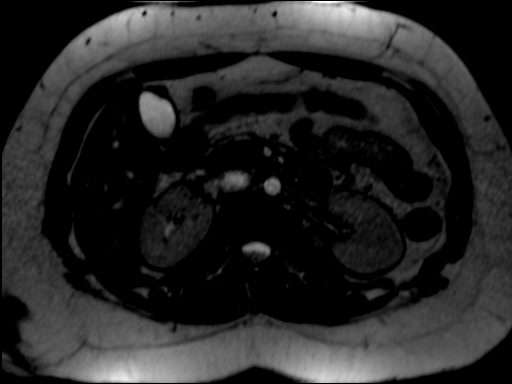
[im 61/61]
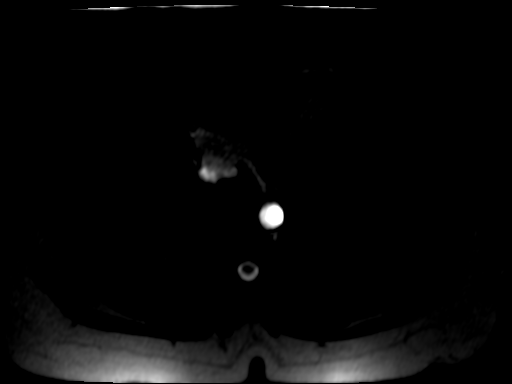

[Series 7: T2 · axial · 6.0mm · 1.09mm/px · z∈[-48,+176]mm · 2 of 32 slices shown]
[im 1/32]
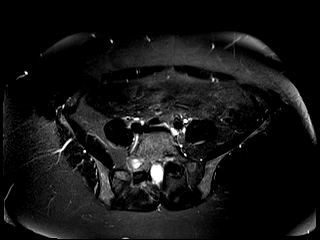
[im 32/32]
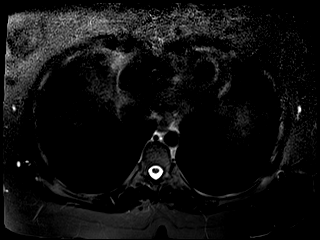

[Series 10: T1 dynamic · axial · non-contrast · 2.5mm · 0.74mm/px · z∈[-23,+195]mm · 3 of 88 slices shown]
[im 1/88]
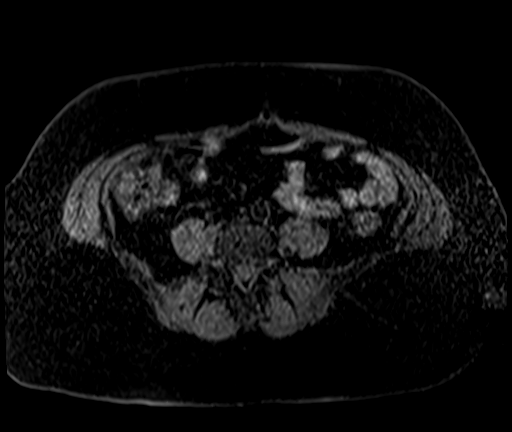
[im 44/88]
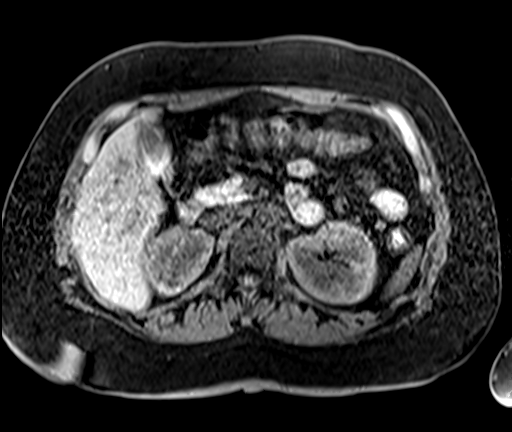
[im 88/88]
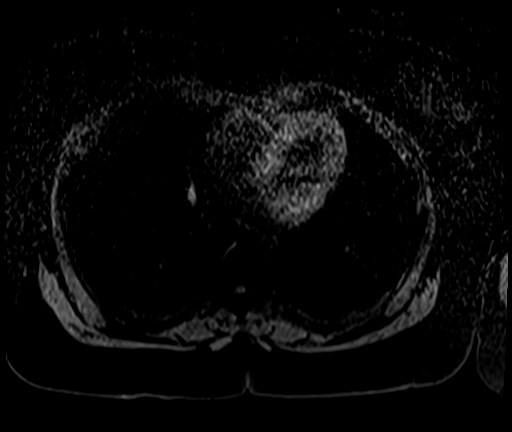

[Series 11: [id number]_trig · axial · 6.0mm · 1.82mm/px · z∈[-33,+219]mm · 4 of 108 slices shown]
[im 1/108]
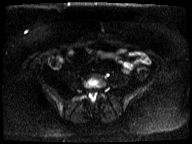
[im 36/108]
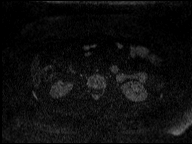
[im 72/108]
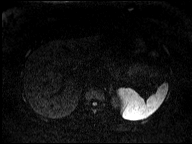
[im 108/108]
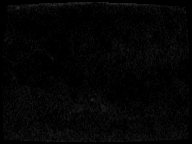

[Series 12: [id number]_trig_adc · axial · 6.0mm · 1.82mm/px · 1 of 36 slices shown]
[im 1/36]
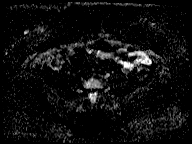

[Series 13: T1 dynamic post-contrast · axial · 2.5mm · 0.74mm/px · z∈[-23,+195]mm · 3 of 88 slices shown (1 of 3)]
[im 1/88]
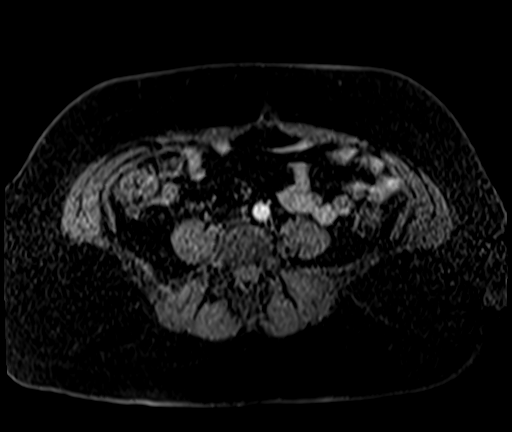
[im 44/88]
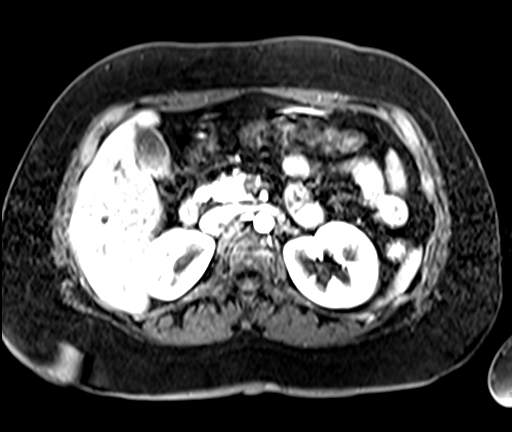
[im 88/88]
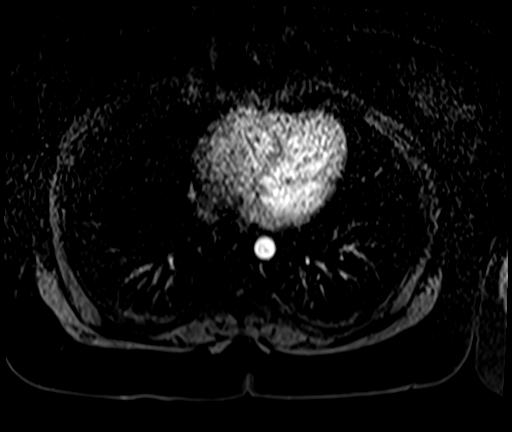

[Series 14: T1 dynamic post-contrast · axial · 2.5mm · 0.74mm/px · z∈[-23,+195]mm · 3 of 88 slices shown (2 of 3)]
[im 1/88]
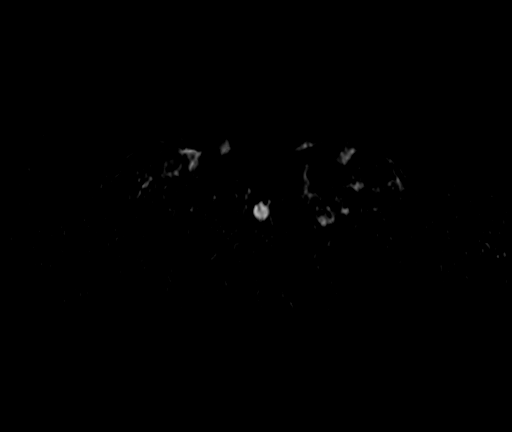
[im 44/88]
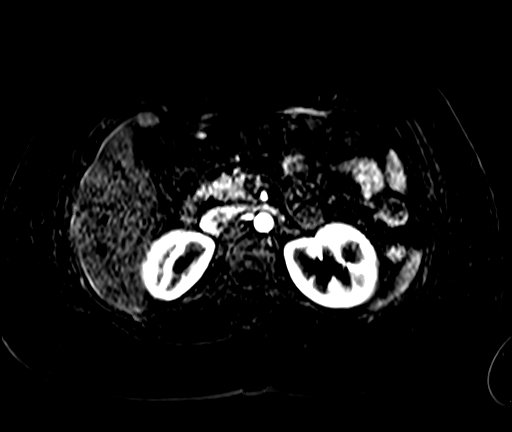
[im 88/88]
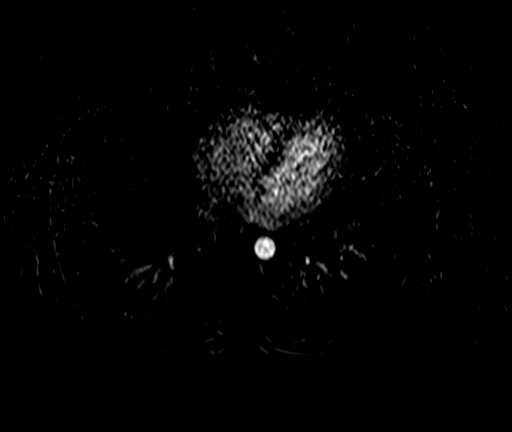

[Series 15: T1 dynamic post-contrast · axial · 2.5mm · 0.74mm/px · 1 of 88 slices shown (3 of 3)]
[im 1/88]
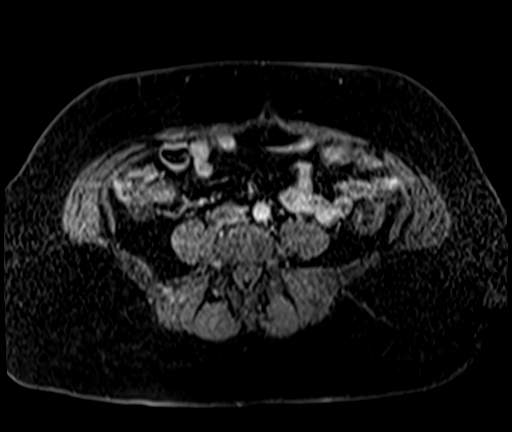

[28 of 48 positions shown; findings below may reference images not displayed]

FINDINGS: Lower chest: Unremarkable

Hepatobiliary: Several tiny T2 signal focal hyperintensities in the
liver do not enhance and are most compatible with tiny cysts or
biliary hamartomas. No biliary dilatation. Suspected Phrygian cap
along the gallbladder fundus, not appreciably changed from
04/26/2007.

Pancreas:  Unremarkable

Spleen:  Unremarkable

Adrenals/Urinary Tract: Contour deformity in the right kidney lower
pole related to the prior infarct. This contributed to the
appearance suspicious for tumor on prior ultrasound, but in fact
simply represents abnormal lower pole contour from the prior infarct
which occurred in April 2007. There is no worrisome mass in this
lesion a although there is a tiny cyst of the right kidney lower
pole measuring about 0.87 cm in diameter, with no abnormal
enhancement. There is a tiny cyst of the left kidney lower pole as
well. No renal mass is identified. The adrenal glands appear normal.

Stomach/Bowel: Scattered diverticula of the descending colon.

Vascular/Lymphatic:  Unremarkable

Other: There a few scattered small cutaneous lesions along the right
lower breast, abdomen, and back region, some of which were present
back in 9992, suggesting benign cutaneous lesions.

Musculoskeletal: Unremarkable
IMPRESSION: 1. Contour deformity in the right kidney lower pole shown on recent
ultrasound is related to the prior right renal infarct which
occurred in April 2007. The appearance of mass on ultrasound
represents residual normal kidney. There is no worrisome mass in
this lesion.
2. Several small cutaneous lesions along the right lower breast,
abdomen, and back region, some of which were present back to 9992,
suggesting benign cutaneous lesions.
3. Several tiny cysts or biliary hamartomas in the liver.
4. Scattered diverticula of the descending colon.

## 2021-01-13 IMAGING — DX DG ANKLE COMPLETE 3+V*R*
3 series · 3 of 3 positions shown · non-contrast
Comparison: None.

CLINICAL DATA: Right ankle pain after fall

EXAM:
RIGHT ANKLE - COMPLETE 3+ VIEW

[ankle ap]
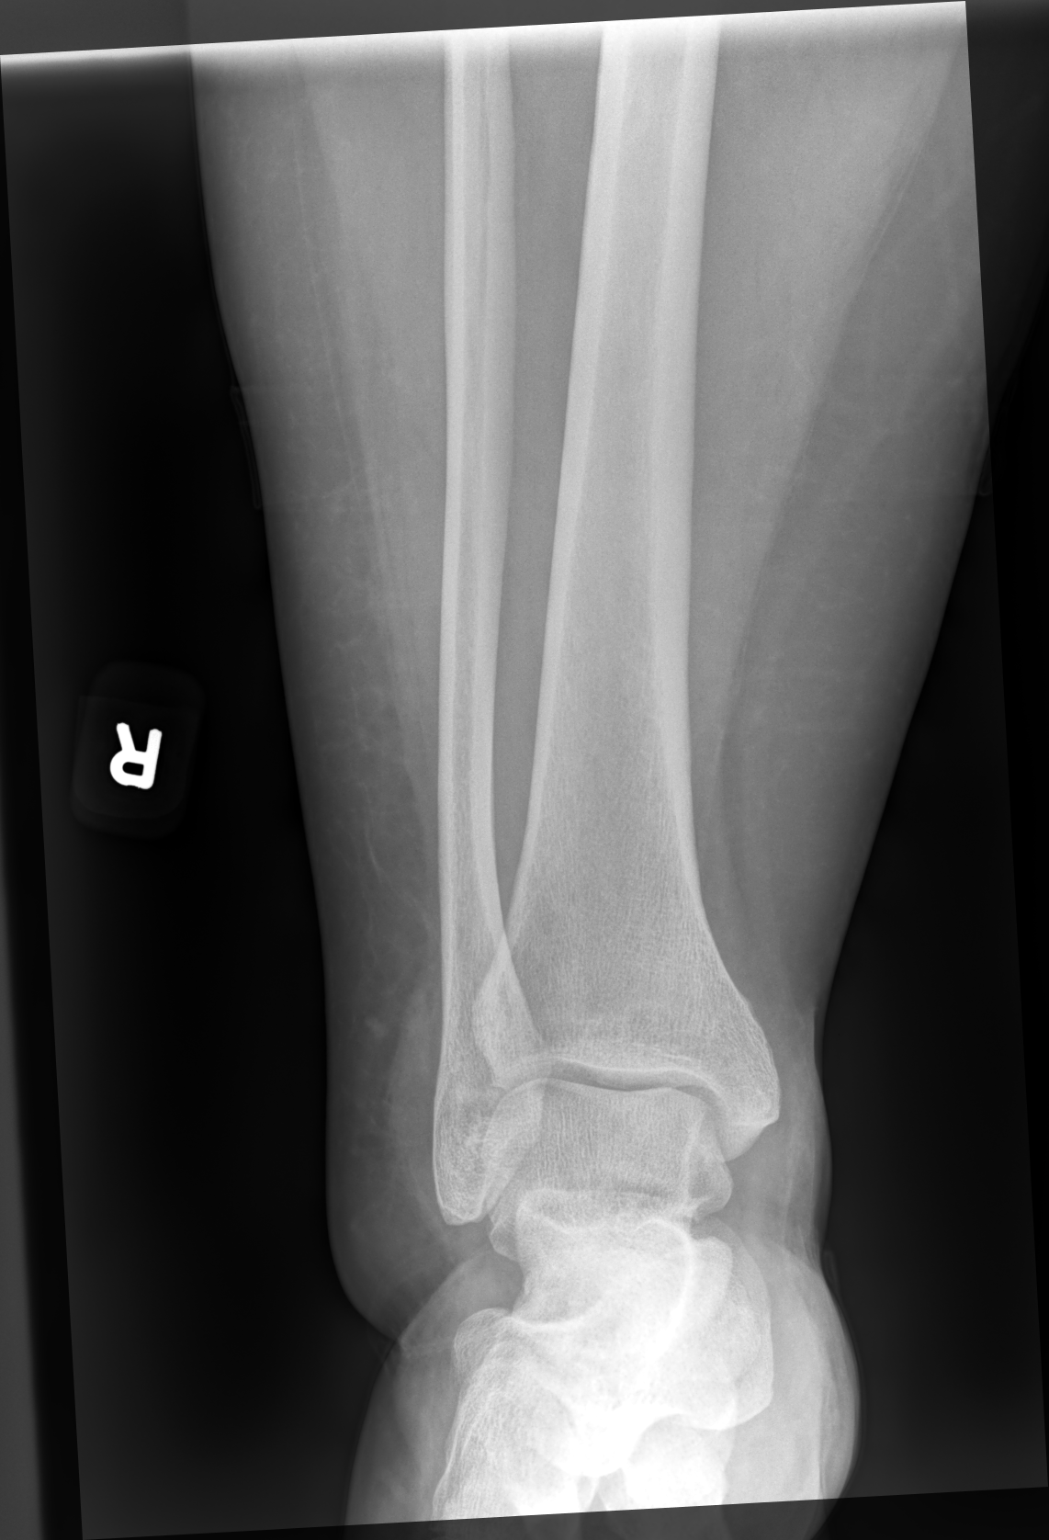

[ankle mlo]
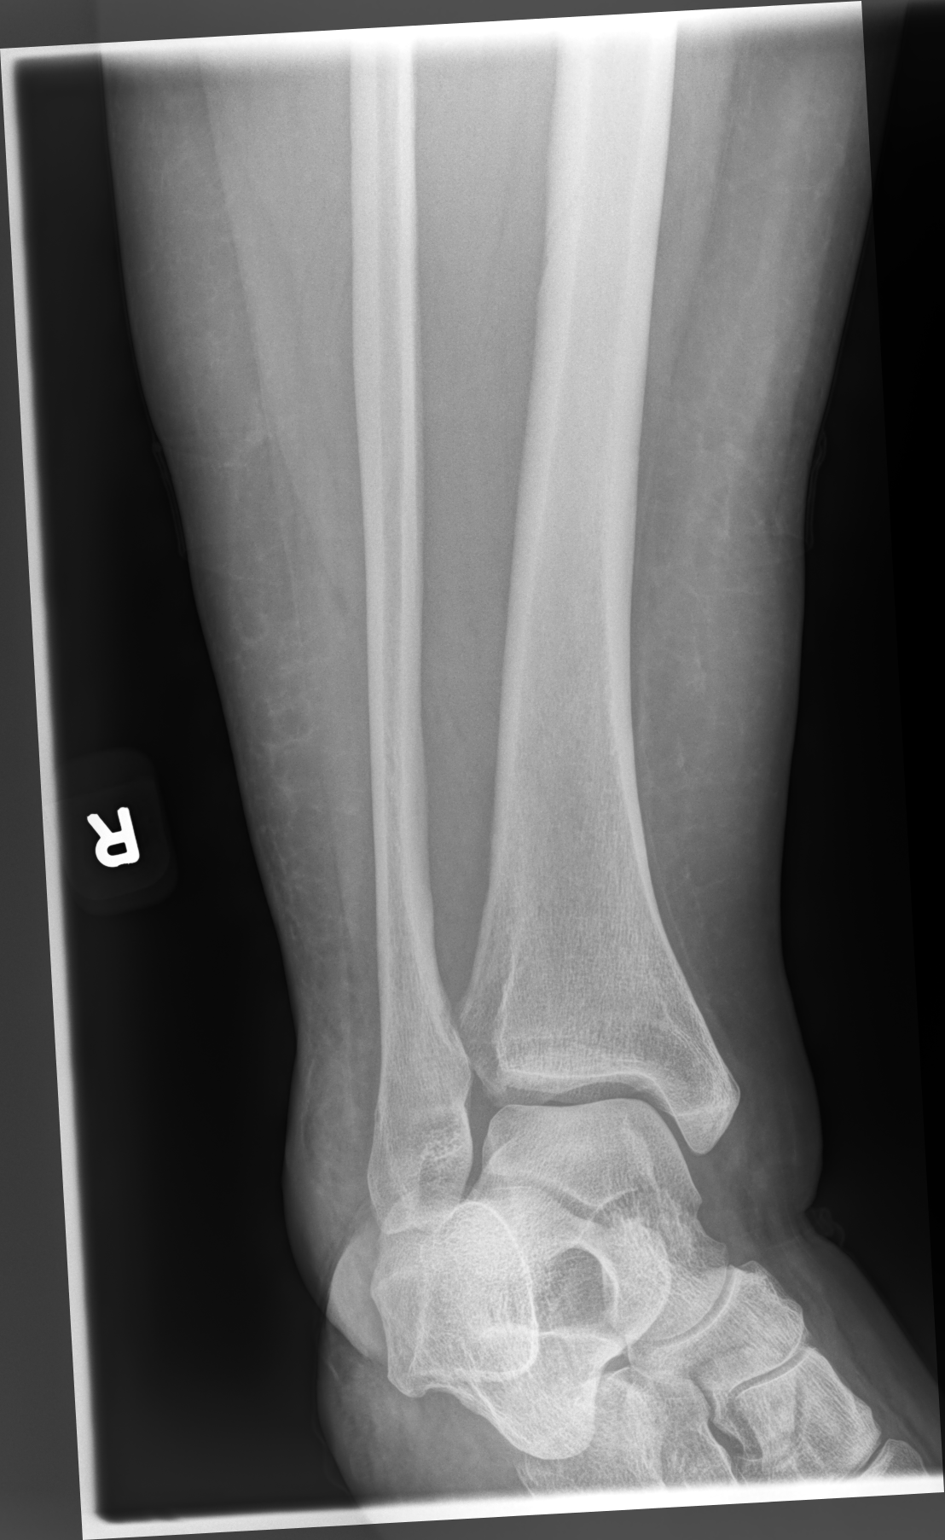

[ankle lat]
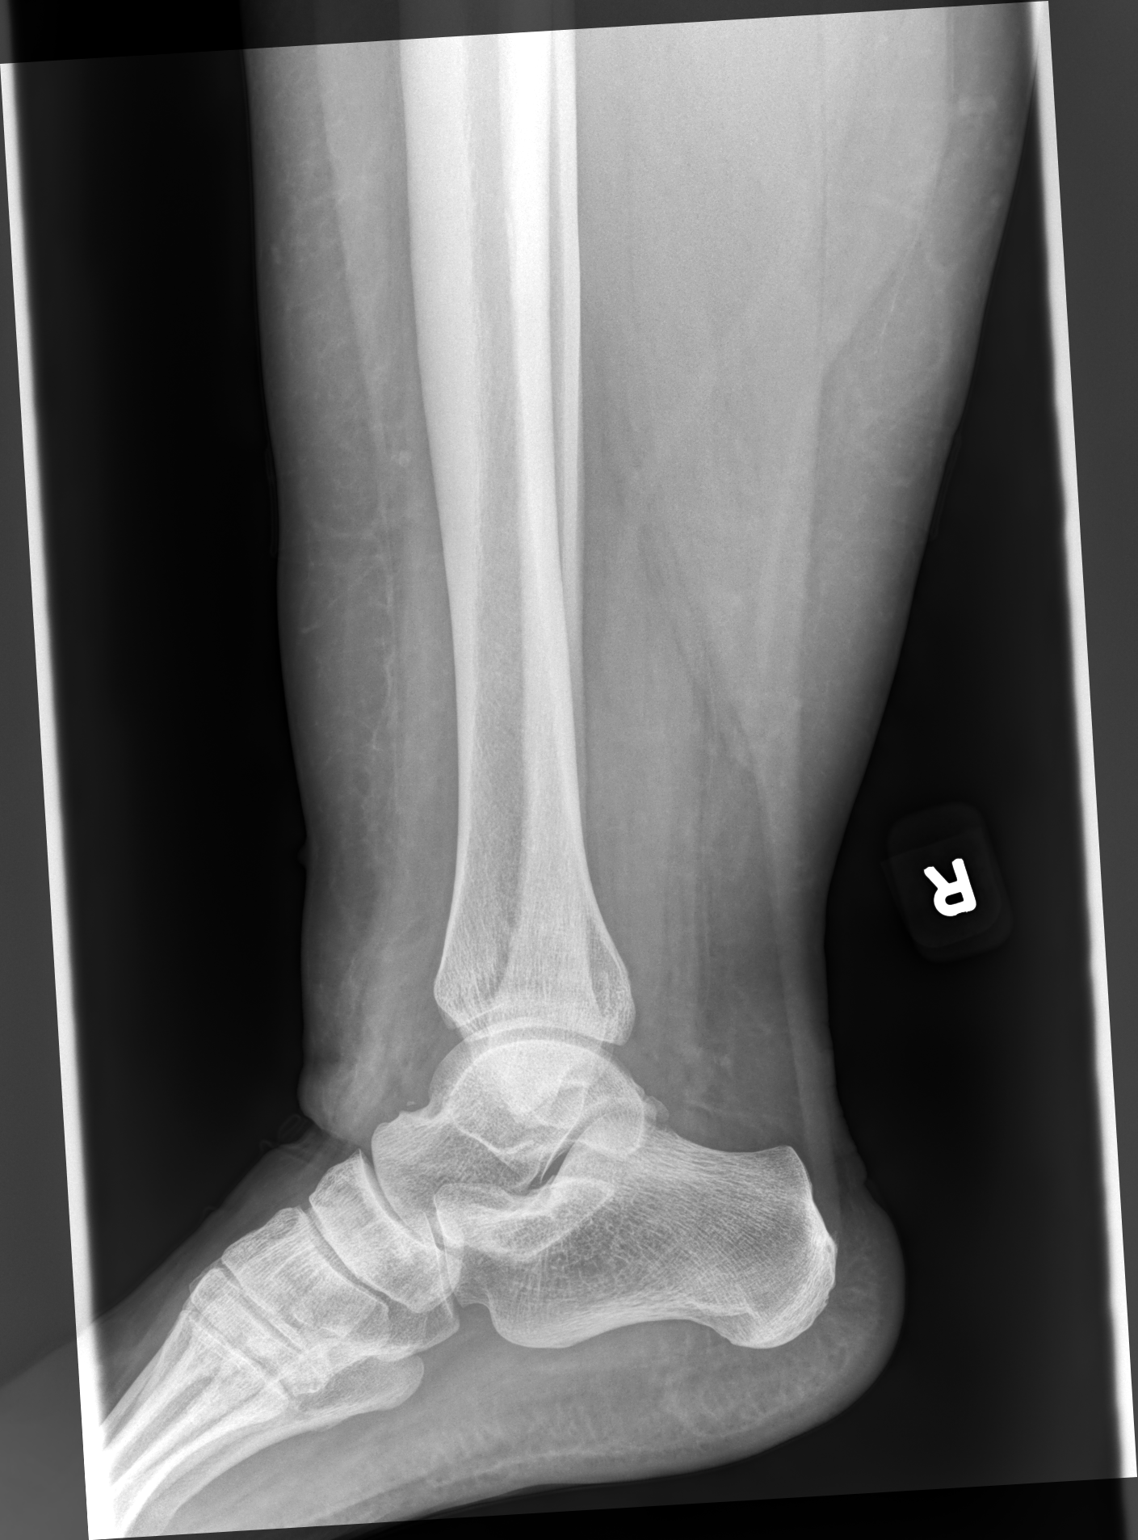

[3 of 3 positions shown; findings below may reference images not displayed]

FINDINGS: There is no evidence of fracture, dislocation, or joint effusion.
There is no evidence of arthropathy or other focal bone abnormality.
Soft tissue swelling overlies the lateral malleolus.
IMPRESSION: Lateral ankle soft tissue swelling without acute fracture or
dislocation.

## 2021-03-15 ENCOUNTER — Ambulatory Visit: Payer: BC Managed Care – PPO

## 2022-04-28 ENCOUNTER — Other Ambulatory Visit: Payer: Self-pay | Admitting: Obstetrics and Gynecology

## 2022-04-28 DIAGNOSIS — E042 Nontoxic multinodular goiter: Secondary | ICD-10-CM

## 2022-04-29 ENCOUNTER — Other Ambulatory Visit: Payer: 59

## 2022-04-30 ENCOUNTER — Ambulatory Visit
Admission: RE | Admit: 2022-04-30 | Discharge: 2022-04-30 | Disposition: A | Discharge status: Home / Self Care | Payer: BC Managed Care – PPO | Source: Ambulatory Visit | Attending: Obstetrics and Gynecology | Admitting: Obstetrics and Gynecology

## 2022-04-30 DIAGNOSIS — E042 Nontoxic multinodular goiter: Secondary | ICD-10-CM

## 2022-05-02 ENCOUNTER — Other Ambulatory Visit: Payer: Self-pay | Admitting: Obstetrics and Gynecology

## 2022-05-02 DIAGNOSIS — E041 Nontoxic single thyroid nodule: Secondary | ICD-10-CM

## 2022-05-20 ENCOUNTER — Other Ambulatory Visit (HOSPITAL_COMMUNITY)
Admission: RE | Admit: 2022-05-20 | Discharge: 2022-05-20 | Disposition: A | Discharge status: Home / Self Care | Payer: BC Managed Care – PPO | Source: Ambulatory Visit | Attending: Obstetrics and Gynecology | Admitting: Obstetrics and Gynecology

## 2022-05-20 ENCOUNTER — Ambulatory Visit
Admission: RE | Admit: 2022-05-20 | Discharge: 2022-05-20 | Disposition: A | Discharge status: Home / Self Care | Payer: BC Managed Care – PPO | Source: Ambulatory Visit | Attending: Obstetrics and Gynecology | Admitting: Obstetrics and Gynecology

## 2022-05-20 DIAGNOSIS — E041 Nontoxic single thyroid nodule: Secondary | ICD-10-CM | POA: Diagnosis present

## 2022-05-22 LAB — CYTOLOGY - NON PAP

## 2022-06-09 ENCOUNTER — Encounter (HOSPITAL_COMMUNITY): Payer: Self-pay

## 2022-07-17 ENCOUNTER — Ambulatory Visit: Payer: BC Managed Care – PPO | Admitting: Gastroenterology

## 2022-12-02 NOTE — Progress Notes (Deleted)
Name: Karina [name]  MRN/ DOB: [id number], Dec 25, 1980    Age/ Sex: 42 y.o., female    PCP: System, Provider Not In   Reason for Endocrinology Evaluation: MNG     Date of Initial Endocrinology Evaluation: 12/02/2022     HPI: Ms. Karina [name] is a 42 y.o. female with a past medical history of ***. The patient presented for initial endocrinology clinic visit on 12/02/2022 for consultative assistance with her MNG.   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 She has been smoking an average of 1 pack per day. She has never used smokeless tobacco. She reports current alcohol use. She reports that she does not use drugs. Family History: family history includes Cancer in her mother.   HOME MEDICATIONS: Allergies as of 12/03/2022   No Known Allergies      Medication List        Accurate as of December 02, 2022  5:01 PM. If you have any questions, ask your nurse or doctor.          clonazePAM 0.5 MG tablet Commonly known as: KLONOPIN Take 0.5 mg by mouth 2 (two) times daily.   ibuprofen 200 MG tablet Commonly known as: ADVIL Take 600 mg by mouth every 6 (six) hours as needed for moderate pain.   meloxicam 15 MG tablet Commonly known as: Mobic Take 1 tablet (15 mg total) by mouth daily.   omeprazole 20 MG capsule Commonly known as: PRILOSEC Take 1 capsule (20 mg total) by  mouth daily.   predniSONE 10 MG (21) Tbpk tablet Commonly known as: STERAPRED UNI-PAK 21 TAB Take by mouth daily. Take 6 tabs by mouth daily  for 2 days, then 5 tabs for 2 days, then 4 tabs for 2 days, then 3 tabs for 2 days, 2 tabs for 2 days, then 1 tab by mouth daily for 2 days   tizanidine 2 MG capsule Commonly known as: Zanaflex Take 1 capsule (2 mg total) by mouth 3 (three) times daily.          REVIEW OF SYSTEMS: A comprehensive ROS was conducted with the patient and is negative except as per HPI and below:  ROS     OBJECTIVE:  VS: There were no vitals taken for this visit.   Wt Readings from Last 3 Encounters:  03/17/19 187 lb (84.8 kg)  05/11/14 186 lb (84.4 kg)  12/20/12 190 lb (86.2 kg)     EXAM: General: Pt appears well and is in NAD  Eyes: External eye exam normal without stare, lid lag or exophthalmos.  EOM intact.  PERRL.  Neck: General: Supple without adenopathy. Thyroid: Thyroid size normal.  No goiter or nodules appreciated. No thyroid bruit.  Lungs: Clear with good BS bilat with no rales, rhonchi, or wheezes  Heart: Auscultation: RRR.  Abdomen: Normoactive bowel sounds,  soft, nontender, without masses or organomegaly palpable  Extremities:  BL LE: No pretibial edema normal ROM and strength.  Mental Status: Judgment, insight: Intact Orientation: Oriented to time, place, and person Mood and affect: No depression, anxiety, or agitation     DATA REVIEWED: ***    Thyroid ultrasound 8/3T/2023 Estimated total number of nodules >/= 1 cm: 5   Number of spongiform nodules >/=  2 cm not described below (TR1): 0   Number of mixed cystic and solid nodules >/= 1.5 cm not described below ([place]): 0   _________________________________________________________   Nodule # 1: 3.4 x 1.8 x 3 cm isthmic nodule, previously 3.5 x 3 x 2.6 on 01/30/2017; stability for greater than 5 years implies benignity. This nodule does NOT meet TI-RADS criteria for biopsy  or dedicated follow-up.   Nodule # 2:   Prior biopsy: No   Location: Right; superior   Maximum size: 3 cm; Other 2 dimensions: 1.8 x 1.9 cm, previously, 1.8 x 1.2 x 1.3 cm   Composition: solid/almost completely solid (2)   Echogenicity: isoechoic (1)   Shape: not taller-than-wide (0)   Margins: ill-defined (0)   Echogenic foci: none (0)   ACR TI-RADS total points: 3.   ACR TI-RADS risk category:  TR 3.   Significant change in size (>/= 20% in two dimensions and minimal increase of 2 mm): Yes   Change in features: No   Change in ACR TI-RADS risk category: No   ACR TI-RADS recommendations:   **Given size (>/= 2.5 cm) and appearance, fine needle aspiration of this mildly suspicious nodule should be considered based on TI-RADS criteria.   _________________________________________________________   Nodule 3: 0.8 cm hypoechoic nodule without calcifications inferior right, previously 0.7; This nodule does NOT meet TI-RADS criteria for biopsy or dedicated follow-up.   Nodule 4: 1 cm hypoechoic nodule without calcifications, superior left, previously 1.3; stability for greater than 5 years implies benignity. This nodule does NOT meet TI-RADS criteria for biopsy or dedicated follow-up.   Nodule # 5:   Location: Left; mid   Maximum size: 1.3 cm; Other 2 dimensions: 1 x 1.2 cm   Composition: solid/almost completely solid (2)   Echogenicity: isoechoic (1)   Shape: not taller-than-wide (0)   Margins: smooth (0)   Echogenic foci: macrocalcifications (1)   ACR TI-RADS total points: 4.   ACR TI-RADS risk category: TR 4.   ACR TI-RADS recommendations:   *Given size (>/= 1 - 1.4 cm) and appearance, a follow-up ultrasound in 1 year should be considered based on TI-RADS criteria.   _________________________________________________________   Nodule # 6:   Prior biopsy: No   Location: Left; inferior   Maximum size: 2.1 cm; Other 2 dimensions: 2 x 2 cm,  previously, 1.7 x 1.3 x 1.6 cm   Composition: solid/almost completely solid (2)   Echogenicity: isoechoic (1)   Shape: not taller-than-wide (0)   Margins: ill-defined (0)   Echogenic foci: none (0)   ACR TI-RADS total points: 3.   ACR TI-RADS risk category:  TR 3.   Significant change in size (>/= 20% in two dimensions and minimal increase of 2 mm): Yes   Change in features: No   Change in ACR TI-RADS risk category: No   ACR TI-RADS recommendations:   *Given size (>/= 1.5 - 2.4 cm) and appearance, a follow-up ultrasound in 1 year should be considered based on TI-RADS criteria.   _________________________________________________________   No regional cervical adenopathy identified.   IMPRESSION: 1.  Thyromegaly with bilateral nodules. 2. Recommend FNA biopsy of enlarging mildly suspicious 3 cm superior right nodule. 3. Recommend annual/biennial ultrasound follow-up of additional nodules as above, until stability x5 years confirmed.   The above is in keeping with the ACR TI-RADS recommendations - J Am Coll Radiol 2017;14:587-595. ASSESSMENT/PLAN/RECOMMENDATIONS:   Multinodular goiter    Medications :  Signed electronically by: Mack Guise, MD  [facility]  [facility] [address] [place], [place] [address] Phone: [phone] FAX: [phone]   CC: System, Provider Not In No address on file Phone: None Fax: None   Return to Endocrinology clinic as below: Future Appointments  Date Time Provider [place]  12/03/2022  2:20 PM Shallyn Constancio, Melanie Crazier, MD [id number] None

## 2022-12-03 ENCOUNTER — Ambulatory Visit: Payer: BC Managed Care – PPO | Admitting: Internal Medicine

## 2023-02-04 ENCOUNTER — Ambulatory Visit
Admission: RE | Admit: 2023-02-04 | Discharge: 2023-02-04 | Disposition: A | Discharge status: Home / Self Care | Payer: Self-pay | Source: Ambulatory Visit | Attending: Emergency Medicine | Admitting: Emergency Medicine

## 2023-02-04 VITALS — BP 118/58 | HR 78 | Temp 98.5°F | Resp 18

## 2023-02-04 DIAGNOSIS — M25511 Pain in right shoulder: Secondary | ICD-10-CM

## 2023-02-04 MED ORDER — CYCLOBENZAPRINE HCL 10 MG PO TABS: 10.0000 mg | ORAL_TABLET | Freq: Every day | ORAL | 0 refills | Status: AC

## 2023-02-04 MED ORDER — PREDNISONE 20 MG PO TABS: 40.0000 mg | ORAL_TABLET | Freq: Every day | ORAL | 0 refills | Status: AC

## 2023-02-04 MED ORDER — KETOROLAC TROMETHAMINE 30 MG/ML IJ SOLN
30.0000 mg | Freq: Once | INTRAMUSCULAR | Status: AC
  Administered 2023-02-04: 30 mg via INTRAMUSCULAR

## 2023-02-04 NOTE — ED Triage Notes (Signed)
Right shoulder pain x 2 weeks.  No known injury.  Pain radiates down to elbow.

## 2023-02-04 NOTE — Discharge Instructions (Signed)
Your pain is most likely caused by irritation to the muscles .  You have been given an injection of Toradol today here in the office, this medicine helps to reduce inflammation and help with pain, daily you will start to see some relief in 30 minutes to an hour  Starting tomorrow you may take prednisone every morning with food for 5 days to continue the above process, you may take Tylenol throughout the day as needed in addition to this  You may use muscle relaxant at bedtime as needed for additional comfort, be mindful this may make you feel drowsy  You may use heating pad in 15 minute intervals as needed for additional comfort, you may find comfort in using ice in 10-15 minutes over affected area  Begin stretching affected area daily for 10 minutes as tolerated to further loosen muscles   place pillow underneath the arm and behind neck and back when sitting and lying  If pain persist after recommended treatment or reoccurs if may be beneficial to follow up with orthopedic specialist for evaluation, this doctor specializes in the bones and can manage your symptoms long-term with options such as but not limited to imaging, medications or physical therapy

## 2023-02-04 NOTE — ED Provider Notes (Signed)
[facility]    CSN: [id number] Arrival date & time: 02/04/23  1011      History   Chief Complaint Chief Complaint  Patient presents with   Shoulder Pain    Entered by patient    HPI Karina [name] is a 42 y.o. female.   Patient presents for evaluation of right shoulder and upper arm pain beginning 2 weeks ago.  Pain has been constant described as a pulling sensation, starts at the top of the shoulder and radiates down the side of the arm stopping at the elbow.  Associated numbness and tingling.  Pain exacerbated when lifting arm above head and when gripping objects.  Endorses that she works at [employer] and constantly lifts heavy objects.  Has had similar symptoms in the past that resolved after taking a short leave from work.  Denies injury or trauma.    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"   MRSA (methicillin resistant Staphylococcus aureus)    (labial, and posterior thigh)   Smoker     Patient Active Problem List   Diagnosis Date Noted   GERD (gastroesophageal reflux disease) 12/20/2012   Tobacco use disorder 12/20/2012   Obesity (BMI 30-39.9) 12/20/2012    Past Surgical History:  Procedure Laterality Date   KIDNEY SURGERY  2007   TO REMOVE BLO)D CLOT    OB History     Gravida  0   Para  0   Term  0   Preterm  0   AB  0   Living  0      SAB  0   IAB  0   Ectopic  0   Multiple  0   Live Births               Home Medications    Prior to Admission medications   Medication Sig Start Date End Date Taking? Authorizing Provider  clonazePAM (KLONOPIN) 0.5 MG tablet Take 0.5 mg by mouth 2 (two) times daily.    [provider]  ibuprofen (ADVIL,MOTRIN) 200 MG tablet Take 600 mg by mouth every 6 (six) hours as needed for moderate pain.    [provider]  omeprazole (PRILOSEC) 20 MG capsule Take 1 capsule (20 mg total) by mouth daily. 08/29/19   Janace Aris, NP    Family History Family History  Problem Relation Age of Onset   Cancer Mother        vulvar cancer   Heart disease Neg Hx    Stroke Neg Hx     Social History Social History   Tobacco Use   Smoking status: Every Day    Packs/day: 1    Types: Cigarettes   Smokeless tobacco: Never  Vaping Use   Vaping Use: Every day   Substances: Nicotine  Substance Use Topics   Alcohol use: Yes    Comment: very rarely.   Drug use: No     Allergies   Patient has no known allergies.   Review of Systems Review of Systems   Physical Exam Triage Vital Signs ED Triage Vitals  Enc Vitals Group     BP 02/04/23 1040 (!) 118/58     Pulse Rate 02/04/23 1040 78     Resp 02/04/23 1040 18     Temp 02/04/23 1040 98.5 F (36.9 C)     Temp Source 02/04/23 1040 Oral     SpO2  02/04/23 1040 95 %     Weight --      Height --      Head Circumference --      Peak Flow --      Pain Score 02/04/23 1041 8     Pain Loc --      Pain Edu? --      Excl. in GC? --    No data found.  Updated Vital Signs BP (!) 118/58 (BP Location: Right Arm)   Pulse 78   Temp 98.5 F (36.9 C) (Oral)   Resp 18   SpO2 95%   Visual Acuity Right Eye Distance:   Left Eye Distance:   Bilateral Distance:    Right Eye Near:   Left Eye Near:    Bilateral Near:     Physical Exam Constitutional:      Appearance: Normal appearance.  Eyes:     Extraocular Movements: Extraocular movements intact.  Pulmonary:     Effort: Pulmonary effort is normal.  Musculoskeletal:     Comments: Tenderness is present along the superior aspect of the shoulder without ecchymosis swelling or deformity, able to complete range of motion but pain is elicited with lateral and frontal abduction, 2+ brachial pulse, negative Hawkins sign, no tenderness following the rotator cuff  Neurological:     Mental Status: She is alert and oriented to person, place, and time. Mental status is at baseline.      UC Treatments /  Results  Labs (all labs ordered are listed, but only abnormal results are displayed) Labs Reviewed - No data to display  EKG   Radiology No results found.  Procedures Procedures (including critical care time)  Medications Ordered in UC Medications - No data to display  Initial Impression / Assessment and Plan / UC Course  I have reviewed the triage vital signs and the nursing notes.  Pertinent labs & imaging results that were available during my care of the patient were reviewed by me and considered in my medical decision making (see chart for details).  Acute right shoulder pain  Etiology is most likely muscular related to overuse at work, discussed with patient, will defer imaging at this time due to lack of injury, Toradol injection given in office and prescribed prednisone and Flexeril for outpatient use, recommended RICE, heat massage stretching and activity as tolerated, given walking referral to orthopedics if symptoms continue to persist Final Clinical Impressions(s) / UC Diagnoses   Final diagnoses:  None   Discharge Instructions   None    ED Prescriptions   None    PDMP not reviewed this encounter.   Valinda Hoar, NP 02/04/23 1125

## 2023-04-15 ENCOUNTER — Other Ambulatory Visit: Payer: Self-pay | Admitting: Surgery

## 2023-04-15 DIAGNOSIS — E042 Nontoxic multinodular goiter: Secondary | ICD-10-CM

## 2023-10-13 ENCOUNTER — Other Ambulatory Visit: Payer: Self-pay | Admitting: Obstetrics and Gynecology

## 2023-10-13 DIAGNOSIS — E041 Nontoxic single thyroid nodule: Secondary | ICD-10-CM

## 2023-10-15 ENCOUNTER — Other Ambulatory Visit: Payer: Self-pay

## 2023-10-26 ENCOUNTER — Ambulatory Visit
Admission: RE | Admit: 2023-10-26 | Discharge: 2023-10-26 | Discharge status: Home / Self Care | Payer: Self-pay | Source: Ambulatory Visit | Attending: Obstetrics and Gynecology | Admitting: Obstetrics and Gynecology

## 2023-10-26 DIAGNOSIS — E041 Nontoxic single thyroid nodule: Secondary | ICD-10-CM

## 2023-11-02 ENCOUNTER — Other Ambulatory Visit: Payer: Self-pay | Admitting: Obstetrics and Gynecology

## 2023-11-02 DIAGNOSIS — E041 Nontoxic single thyroid nodule: Secondary | ICD-10-CM

## 2023-11-24 ENCOUNTER — Other Ambulatory Visit (HOSPITAL_COMMUNITY): Payer: Self-pay

## 2023-11-24 MED ORDER — WEGOVY 0.25 MG/0.5ML ~~LOC~~ SOAJ
0.2500 mg | SUBCUTANEOUS | 3 refills | Status: AC
  Filled 2023-11-24 – 2023-11-27 (×3): qty 2, 28d supply, fill #0

## 2023-11-25 ENCOUNTER — Other Ambulatory Visit (HOSPITAL_COMMUNITY): Payer: Self-pay

## 2023-11-27 ENCOUNTER — Other Ambulatory Visit (HOSPITAL_COMMUNITY): Payer: Self-pay

## 2023-12-04 ENCOUNTER — Inpatient Hospital Stay
Admission: RE | Admit: 2023-12-04 | Discharge: 2023-12-04 | Disposition: A | Discharge status: Home / Self Care | Source: Ambulatory Visit | Attending: Obstetrics and Gynecology | Admitting: Obstetrics and Gynecology

## 2023-12-16 ENCOUNTER — Other Ambulatory Visit (HOSPITAL_COMMUNITY): Payer: Self-pay

## 2023-12-16 ENCOUNTER — Inpatient Hospital Stay
Admission: RE | Admit: 2023-12-16 | Discharge: 2023-12-16 | Disposition: A | Discharge status: Home / Self Care | Source: Ambulatory Visit | Attending: Obstetrics and Gynecology | Admitting: Obstetrics and Gynecology

## 2023-12-21 ENCOUNTER — Inpatient Hospital Stay
Admission: RE | Admit: 2023-12-21 | Discharge: 2023-12-21 | Disposition: A | Discharge status: Home / Self Care | Source: Ambulatory Visit | Attending: Obstetrics and Gynecology | Admitting: Obstetrics and Gynecology
